# Patient Record
Sex: Female | Born: 1955 | Race: White | Hispanic: No | Marital: Married | Smoking: Never smoker
Health system: Southern US, Community
[De-identification: ages and names within clinical notes are randomized; demographics above are authoritative.]

## PROBLEM LIST (undated history)

## (undated) DIAGNOSIS — K851 Biliary acute pancreatitis without necrosis or infection: Secondary | ICD-10-CM

## (undated) DIAGNOSIS — S42253A Displaced fracture of greater tuberosity of unspecified humerus, initial encounter for closed fracture: Secondary | ICD-10-CM

## (undated) DIAGNOSIS — F419 Anxiety disorder, unspecified: Secondary | ICD-10-CM

## (undated) DIAGNOSIS — M810 Age-related osteoporosis without current pathological fracture: Secondary | ICD-10-CM

## (undated) DIAGNOSIS — K760 Fatty (change of) liver, not elsewhere classified: Secondary | ICD-10-CM

## (undated) DIAGNOSIS — F32A Depression, unspecified: Secondary | ICD-10-CM

## (undated) DIAGNOSIS — K219 Gastro-esophageal reflux disease without esophagitis: Secondary | ICD-10-CM

## (undated) DIAGNOSIS — R7989 Other specified abnormal findings of blood chemistry: Secondary | ICD-10-CM

## (undated) HISTORY — PX: CERVICAL BIOPSY  W/ LOOP ELECTRODE EXCISION: SUR135

## (undated) HISTORY — PX: COLONOSCOPY: SHX174

## (undated) HISTORY — PX: CHOLECYSTECTOMY: SHX55

## (undated) HISTORY — PX: FRACTURE SURGERY: SHX138

## (undated) HISTORY — PX: EYE SURGERY: SHX253

## (undated) HISTORY — PX: KNEE SURGERY: SHX244

## (undated) HISTORY — PX: OOPHORECTOMY: SHX86

## (undated) HISTORY — PX: REFRACTIVE SURGERY: SHX103

---

## 2020-01-16 ENCOUNTER — Other Ambulatory Visit: Payer: Self-pay | Admitting: Specialist

## 2020-01-16 DIAGNOSIS — Z1231 Encounter for screening mammogram for malignant neoplasm of breast: Secondary | ICD-10-CM

## 2020-02-10 ENCOUNTER — Other Ambulatory Visit: Payer: Self-pay | Admitting: Specialist

## 2020-02-10 DIAGNOSIS — M810 Age-related osteoporosis without current pathological fracture: Secondary | ICD-10-CM

## 2020-03-10 ENCOUNTER — Ambulatory Visit
Admission: RE | Admit: 2020-03-10 | Discharge: 2020-03-10 | Disposition: A | Discharge status: Home / Self Care | Payer: BC Managed Care – PPO | Source: Ambulatory Visit | Attending: Specialist | Admitting: Specialist

## 2020-03-10 ENCOUNTER — Other Ambulatory Visit: Payer: Self-pay

## 2020-03-10 DIAGNOSIS — M810 Age-related osteoporosis without current pathological fracture: Secondary | ICD-10-CM | POA: Diagnosis present

## 2020-03-10 DIAGNOSIS — Z1231 Encounter for screening mammogram for malignant neoplasm of breast: Secondary | ICD-10-CM | POA: Insufficient documentation

## 2020-12-21 ENCOUNTER — Other Ambulatory Visit: Payer: Self-pay | Admitting: Specialist

## 2020-12-21 DIAGNOSIS — Z1231 Encounter for screening mammogram for malignant neoplasm of breast: Secondary | ICD-10-CM

## 2021-03-13 ENCOUNTER — Other Ambulatory Visit: Payer: Self-pay

## 2021-03-13 ENCOUNTER — Ambulatory Visit (INDEPENDENT_AMBULATORY_CARE_PROVIDER_SITE_OTHER): Payer: BC Managed Care – PPO

## 2021-03-13 ENCOUNTER — Ambulatory Visit
Admission: EM | Admit: 2021-03-13 | Discharge: 2021-03-13 | Disposition: A | Discharge status: Home / Self Care | Payer: BC Managed Care – PPO | Attending: Family Medicine | Admitting: Family Medicine

## 2021-03-13 DIAGNOSIS — W19XXXA Unspecified fall, initial encounter: Secondary | ICD-10-CM | POA: Diagnosis not present

## 2021-03-13 DIAGNOSIS — S52501A Unspecified fracture of the lower end of right radius, initial encounter for closed fracture: Secondary | ICD-10-CM

## 2021-03-13 DIAGNOSIS — M25531 Pain in right wrist: Secondary | ICD-10-CM

## 2021-03-13 MED ORDER — OXYCODONE-ACETAMINOPHEN 5-325 MG PO TABS: 1.0000 | ORAL_TABLET | Freq: Three times a day (TID) | ORAL | 0 refills | Status: DC | PRN

## 2021-03-13 NOTE — Discharge Instructions (Addendum)
Medication as needed.  If you worsen, go to the ER.  Please call [facility] clinic Orthopedics [phone]) OR [facility] [phone]) for an appt.  Take care  Dr. Adriana Simas

## 2021-03-13 NOTE — ED Triage Notes (Signed)
Pt c/o fall today and and injured her right wrist. Pt also has scrapes to her right knee along with some swelling, denies pain. Pt also reports some neck pain. Pt does have swelling and bruising to the wrist, with decreased ROM. Pt has full sensation to the fingers, slight decreased ROM. Pt denies hitting her head or any other significant injuries.

## 2021-03-13 NOTE — Consult Note (Signed)
  ORTHOPAEDIC CONSULTATION  REQUESTING PHYSICIAN: Tommie Sams, DO  Chief Complaint: Right wrist pain status post fall  HPI: I was contacted by Dr. Adriana Simas at the [facility] urgent care regarding Linda Ritter, who is a 65 y.o. female who sustained a fall prior to her presentation.  X-rays of the right wrist demonstrated a distal radius fracture.  Orthopedics is consulted for recommendations on acute management.  Dr. Adriana Simas indicated the patient is neurovascular intact and skin is closed over the right wrist.  History reviewed. No pertinent past medical history. Past Surgical History:  Procedure Laterality Date   OOPHORECTOMY Right    Social History   Socioeconomic History   Marital status: Married    Spouse name: Not on file   Number of children: Not on file   Years of education: Not on file   Highest education level: Not on file  Occupational History   Not on file  Tobacco Use   Smoking status: Never   Smokeless tobacco: Never  Vaping Use   Vaping Use: Never used  Substance and Sexual Activity   Alcohol use: Not on file   Drug use: Not on file   Sexual activity: Not on file  Other Topics Concern   Not on file  Social History Narrative   Not on file   Social Determinants of Health   Financial Resource Strain: Not on file  Food Insecurity: Not on file  Transportation Needs: Not on file  Physical Activity: Not on file  Stress: Not on file  Social Connections: Not on file   Family History  Problem Relation Age of Onset   Breast cancer Neg Hx    No Known Allergies Prior to Admission medications   Medication Sig Start Date End Date Taking? Authorizing Provider  ergocalciferol (VITAMIN D2) 1.25 MG (50000 UT) capsule Take by mouth.   Yes [provider]  oxyCODONE-acetaminophen (PERCOCET/ROXICET) 5-325 MG tablet Take 1 tablet by mouth every 8 (eight) hours as needed for severe pain. 03/13/21  Yes Cook, Jayce G, DO  sertraline (ZOLOFT) 25 MG tablet Take 25 mg by  mouth daily. 03/11/21  Yes [provider]   DG Wrist Complete Right  Result Date: 03/13/2021 CLINICAL DATA:  Fall today EXAM: RIGHT WRIST - COMPLETE 3+ VIEW COMPARISON:  None. FINDINGS: Comminuted fracture distal radius extending into the wrist joint. Angulation and ventral displacement of the wrist. No fracture of the ulna. No carpal fracture. IMPRESSION: Comminuted intra-articular fracture distal radius. Electronically Signed   By: Marlan Palau M.D.   On: 03/13/2021 12:19     Assessment: Volarly angulated right comminuted, closed distal radius fracture  Plan: I reviewed the patient's plain films.  Patient has a comminuted fracture of the right distal radius with volar angulation and intra-articular extension.  I recommend the patient be splinted in situ.  She may follow-up in our office early next week for further fracture management.  No surgery is required acutely this weekend.  Recommend elevation of the right lower extremity whenever possible.  Patient may benefit from a sling while standing or ambulating to reduce swelling.    Juanell Fairly, MD    03/13/2021 12:46 PM

## 2021-03-13 NOTE — ED Provider Notes (Signed)
[facility]    CSN: [id number] Arrival date & time: 03/13/21  1101      History   Chief Complaint Chief Complaint  Patient presents with   Wrist Pain    right   Fall    03/13/21   HPI  65 year old female presents with the above complaint.  Patient was going for a walk today and suffered a fall after stepping on some debris.  She fell on outstretched right hand.  She reports severe right wrist pain and swelling.  She also has some stiffness of the neck.  She has some abrasions to the right knee.  No head injury.  No reports of syncope.  No relieving factors.  No other injuries.  No other complaints or concerns at this time.   Home Medications    Prior to Admission medications   Medication Sig Start Date End Date Taking? Authorizing Provider  ergocalciferol (VITAMIN D2) 1.25 MG (50000 UT) capsule Take by mouth.   Yes [provider]  oxyCODONE-acetaminophen (PERCOCET/ROXICET) 5-325 MG tablet Take 1 tablet by mouth every 8 (eight) hours as needed for severe pain. 03/13/21  Yes Shrihan Putt G, DO  sertraline (ZOLOFT) 25 MG tablet Take 25 mg by mouth daily. 03/11/21  Yes [provider]    Family History Family History  Problem Relation Age of Onset   Breast cancer Neg Hx     Social History Social History   Tobacco Use   Smoking status: Never   Smokeless tobacco: Never  Vaping Use   Vaping Use: Never used     Allergies   Patient has no known allergies.   Review of Systems Review of Systems Per HPI  Physical Exam Triage Vital Signs ED Triage Vitals  Enc Vitals Group     BP 03/13/21 1120 104/71     Pulse Rate 03/13/21 1120 74     Resp 03/13/21 1120 18     Temp 03/13/21 1120 99 F (37.2 C)     Temp Source 03/13/21 1120 Oral     SpO2 03/13/21 1120 98 %     Weight 03/13/21 1117 140 lb (63.5 kg)     Height 03/13/21 1117 5\' 1"  (1.549 m)     Head Circumference --      Peak Flow --      Pain Score 03/13/21 1117 10     Pain Loc --       Pain Edu? --      Excl. in GC? --    Updated Vital Signs BP 104/71 (BP Location: Left Arm)   Pulse 74   Temp 99 F (37.2 C) (Oral)   Resp 18   Ht 5\' 1"  (1.549 m)   Wt 63.5 kg   SpO2 98%   BMI 26.45 kg/m   Visual Acuity Right Eye Distance:   Left Eye Distance:   Bilateral Distance:    Right Eye Near:   Left Eye Near:    Bilateral Near:     Physical Exam Vitals and nursing note reviewed.  Constitutional:      General: She is not in acute distress.    Appearance: Normal appearance.  HENT:     Head: Normocephalic and atraumatic.  Pulmonary:     Effort: Pulmonary effort is normal. No respiratory distress.  Musculoskeletal:     Comments: Right wrist -swelling noted particularly on the ulnar aspect.  Wrist is diffusely tender to palpation.  Neurovascular intact distally.  Neurological:     Mental  Status: She is alert.  Psychiatric:        Mood and Affect: Mood normal.        Behavior: Behavior normal.    UC Treatments / Results  Labs (all labs ordered are listed, but only abnormal results are displayed) Labs Reviewed - No data to display  EKG   Radiology DG Wrist Complete Right  Result Date: 03/13/2021 CLINICAL DATA:  Fall today EXAM: RIGHT WRIST - COMPLETE 3+ VIEW COMPARISON:  None. FINDINGS: Comminuted fracture distal radius extending into the wrist joint. Angulation and ventral displacement of the wrist. No fracture of the ulna. No carpal fracture. IMPRESSION: Comminuted intra-articular fracture distal radius. Electronically Signed   By: Marlan Palau M.D.   On: 03/13/2021 12:19    Procedures Procedures (including critical care time)  Medications Ordered in UC Medications - No data to display  Initial Impression / Assessment and Plan / UC Course  I have reviewed the triage vital signs and the nursing notes.  Pertinent labs & imaging results that were available during my care of the patient were reviewed by me and considered in my medical decision  making (see chart for details).    65 year old female presents with wrist pain after suffering a fall.  X-ray was obtained and was intimately reviewed by me.  Interpretation: Comminuted intra-articular fracture of the distal radius.  There is angulation and displacement.  Discussed case with on-call orthopedist Dr. Martha Clan.  He recommended splinting and follow-up on Monday.  I suspect that she will need surgical repair.  Placed in sugar-tong splint.  Oxycodone as needed for pain.  Supportive care.  Final Clinical Impressions(s) / UC Diagnoses   Final diagnoses:  Closed fracture of distal end of right radius, unspecified fracture morphology, initial encounter     Discharge Instructions      Medication as needed.  If you worsen, go to the ER.  Please call [facility] clinic Orthopedics [phone]) OR [facility] [phone]) for an appt.  Take care  Dr. Adriana Simas    ED Prescriptions     Medication Sig Dispense Auth. Provider   oxyCODONE-acetaminophen (PERCOCET/ROXICET) 5-325 MG tablet Take 1 tablet by mouth every 8 (eight) hours as needed for severe pain. 15 tablet Everlene Other G, DO      I have reviewed the PDMP during this encounter.   Tommie Sams, [place] 03/13/21 1629

## 2021-03-15 ENCOUNTER — Other Ambulatory Visit: Payer: Self-pay

## 2021-03-15 ENCOUNTER — Ambulatory Visit
Admission: RE | Admit: 2021-03-15 | Discharge: 2021-03-15 | Disposition: A | Discharge status: Home / Self Care | Payer: BC Managed Care – PPO | Source: Ambulatory Visit | Attending: Specialist | Admitting: Specialist

## 2021-03-15 DIAGNOSIS — Z1231 Encounter for screening mammogram for malignant neoplasm of breast: Secondary | ICD-10-CM | POA: Insufficient documentation

## 2022-01-20 ENCOUNTER — Other Ambulatory Visit: Payer: Self-pay | Admitting: Specialist

## 2022-01-20 DIAGNOSIS — Z1231 Encounter for screening mammogram for malignant neoplasm of breast: Secondary | ICD-10-CM

## 2022-03-16 ENCOUNTER — Ambulatory Visit
Admission: RE | Admit: 2022-03-16 | Discharge: 2022-03-16 | Disposition: A | Discharge status: Home / Self Care | Payer: Medicare Other | Source: Ambulatory Visit | Attending: Specialist | Admitting: Specialist

## 2022-03-16 DIAGNOSIS — Z1231 Encounter for screening mammogram for malignant neoplasm of breast: Secondary | ICD-10-CM | POA: Insufficient documentation

## 2022-05-09 ENCOUNTER — Other Ambulatory Visit: Payer: Self-pay | Admitting: Specialist

## 2022-05-09 DIAGNOSIS — M8589 Other specified disorders of bone density and structure, multiple sites: Secondary | ICD-10-CM

## 2022-05-29 ENCOUNTER — Other Ambulatory Visit: Payer: Self-pay

## 2022-05-29 ENCOUNTER — Encounter: Payer: Self-pay | Admitting: Emergency Medicine

## 2022-05-29 ENCOUNTER — Ambulatory Visit (INDEPENDENT_AMBULATORY_CARE_PROVIDER_SITE_OTHER): Payer: Medicare Other

## 2022-05-29 ENCOUNTER — Ambulatory Visit
Admission: EM | Admit: 2022-05-29 | Discharge: 2022-05-29 | Disposition: A | Discharge status: Home / Self Care | Payer: Medicare Other

## 2022-05-29 DIAGNOSIS — M25461 Effusion, right knee: Secondary | ICD-10-CM | POA: Diagnosis not present

## 2022-05-29 DIAGNOSIS — M25562 Pain in left knee: Secondary | ICD-10-CM

## 2022-05-29 DIAGNOSIS — M25561 Pain in right knee: Secondary | ICD-10-CM | POA: Diagnosis not present

## 2022-05-29 DIAGNOSIS — M1120 Other chondrocalcinosis, unspecified site: Secondary | ICD-10-CM | POA: Diagnosis not present

## 2022-05-29 MED ORDER — MELOXICAM 7.5 MG PO TABS: 7.5000 mg | ORAL_TABLET | Freq: Every day | ORAL | 0 refills | Status: AC

## 2022-05-29 NOTE — ED Provider Notes (Signed)
[facility]    CSN: [id number] Arrival date & time: 05/29/22  1404      History   Chief Complaint Chief Complaint  Patient presents with   Fall   Knee Pain    HPI Linda Ritter is a 66 y.o. female.   Pleasant 66 year old female presents today due to concern of bilateral knee pain.  She and her husband were eating at [employer], and walking to her car she stepped in a pothole.  She fell forward onto her knees, slightly twisting her knees.  She has a mild abrasion to her left anterior knee.  She reports tenderness to the left knee primarily to the LCL region and lateral tibial plateau.  Patient has a knee effusion on the right, states this is new since the fall.  Patient has a history of patellar tendon surgery 35 years ago.  She otherwise does not have any chronic knee issues. Has not tried any medications for the pain.   Fall  Knee Pain   History reviewed. No pertinent past medical history.  There are no problems to display for this patient.   Past Surgical History:  Procedure Laterality Date   FRACTURE SURGERY Right    wrist   OOPHORECTOMY Right     OB History   No obstetric history on file.      Home Medications    Prior to Admission medications   Medication Sig Start Date End Date Taking? Authorizing Provider  meloxicam (MOBIC) 7.5 MG tablet Take 1 tablet (7.5 mg total) by mouth daily for 14 days. 05/29/22 06/12/22 Yes Happy Ky L, PA  Multiple Vitamin (MULTIVITAMIN) tablet Take 1 tablet by mouth daily.   Yes [provider]  sertraline (ZOLOFT) 25 MG tablet Take 25 mg by mouth daily. 03/11/21  Yes [provider]    Family History Family History  Problem Relation Age of Onset   Breast cancer Neg Hx     Social History Social History   Tobacco Use   Smoking status: Never   Smokeless tobacco: Never  Vaping Use   Vaping Use: Never used  Substance Use Topics   Alcohol use: Never   Drug use: Never      Allergies   Patient has no known allergies.   Review of Systems Review of Systems As per HPI  Physical Exam Triage Vital Signs ED Triage Vitals  Enc Vitals Group     BP 05/29/22 1442 136/78     Pulse Rate 05/29/22 1442 80     Resp 05/29/22 1442 16     Temp 05/29/22 1442 98.4 F (36.9 C)     Temp Source 05/29/22 1442 Oral     SpO2 05/29/22 1442 98 %     Weight 05/29/22 1441 139 lb 15.9 oz (63.5 kg)     Height 05/29/22 1441 5\' 1"  (1.549 m)     Head Circumference --      Peak Flow --      Pain Score 05/29/22 1440 5     Pain Loc --      Pain Edu? --      Excl. in GC? --    No data found.  Updated Vital Signs BP 136/78 (BP Location: Right Arm)   Pulse 80   Temp 98.4 F (36.9 C) (Oral)   Resp 16   Ht 5\' 1"  (1.549 m)   Wt 139 lb 15.9 oz (63.5 kg)   SpO2 98%   BMI 26.45 kg/m  Visual Acuity Right Eye Distance:   Left Eye Distance:   Bilateral Distance:    Right Eye Near:   Left Eye Near:    Bilateral Near:     Physical Exam Vitals and nursing note reviewed. Exam conducted with a chaperone present.  Musculoskeletal:     Right knee: Effusion present. No swelling (effusion), deformity, erythema, ecchymosis, lacerations, bony tenderness or crepitus. Normal range of motion. Tenderness present over the patellar tendon. No LCL laxity, MCL laxity, ACL laxity or PCL laxity. Normal alignment, normal meniscus and normal patellar mobility. Normal pulse.     Instability Tests: Anterior drawer test negative. Posterior drawer test negative. Anterior Lachman test negative. Medial McMurray test negative and lateral McMurray test negative.     Left knee: Bony tenderness (lateral tibeal plateau) present. No ecchymosis or crepitus. Normal range of motion. Tenderness present over the lateral joint line. Normal alignment, normal meniscus and normal patellar mobility.     Right lower leg: Normal.     Left lower leg: Normal.       Legs:     Comments: Red line annotates  tenderness Green annotates effusion Blue annotates abrasion      UC Treatments / Results  Labs (all labs ordered are listed, but only abnormal results are displayed) Labs Reviewed - No data to display  EKG   Radiology DG Knee Complete 4 Views Right  Result Date: 05/29/2022 CLINICAL DATA:  Trauma, fall, pain EXAM: RIGHT KNEE - COMPLETE 4+ VIEW COMPARISON:  None Available. FINDINGS: No fracture or dislocation is seen. There is no significant effusion. Moderate degenerative changes are noted with bony spurs and chondrocalcinosis. IMPRESSION: No recent fracture or dislocation is seen in right knee. Degenerative changes are noted. Electronically Signed   By: Ernie Avena M.D.   On: 05/29/2022 15:13   DG Knee Complete 4 Views Left  Result Date: 05/29/2022 CLINICAL DATA:  Trauma, pain and swelling EXAM: LEFT KNEE - COMPLETE 4+ VIEW COMPARISON:  None Available. FINDINGS: No fracture or dislocation is seen. Minimal bony spurs are noted. There is no significant effusion. IMPRESSION: No fracture or dislocation is seen in left knee. Electronically Signed   By: Ernie Avena M.D.   On: 05/29/2022 15:12    Procedures Procedures (including critical care time)  Medications Ordered in UC Medications - No data to display  Initial Impression / Assessment and Plan / UC Course  I have reviewed the triage vital signs and the nursing notes.  Pertinent labs & imaging results that were available during my care of the patient were reviewed by me and considered in my medical decision making (see chart for details).     Acute B knee pain -status post fall.  Bilateral knee x-rays obtained.  No acute fractures or bony trauma noted.  Osteoarthritis noted.  We will do trial of p.o. mobic Knee effusion right -no signs of instability of her knee.  X-ray reveals chondrocalcinosis.  Question if this is the cause.  We will do trial of NSAIDs, if ineffective, patient will need to follow-up with Ortho for  joint aspiration and steroid injection. Chondrocalcinosis -as above Abrasion -mild.  Topical antibiotic ointment and a sterile bandage appropriate.   Final Clinical Impressions(s) / UC Diagnoses   Final diagnoses:  Acute bilateral knee pain  Knee effusion, right  Chondrocalcinosis     Discharge Instructions      You do not have a fracture, dislocation, or avulsion in either knee. You do have numerous bone spurs, which  are consistent with osteoarthritis. 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 This may be the cause of your joint swelling. I have called in an antiinflammatory medication for you to take. Please clean the abrasion with soap and water at home, dress it with topical bacitracin and a bandaid. Please follow up with orthopedics if your symptoms persist >1 week.   ED Prescriptions     Medication Sig Dispense Auth. Provider   meloxicam (MOBIC) 7.5 MG tablet Take 1 tablet (7.5 mg total) by mouth daily for 14 days. 14 tablet Jordain Radin L, [place]      PDMP not reviewed this encounter.   Maretta Bees, [place] 05/29/22 1805

## 2022-05-29 NOTE — ED Triage Notes (Signed)
Pt fell about an hour ago. She states she fell on asphalt. She has bilateral knee pain and abrasion on her left knee. She has some swelling in the area and states she just feels sore all over.

## 2022-05-29 NOTE — Discharge Instructions (Addendum)
You do not have a fracture, dislocation, or avulsion in either knee. You do have numerous bone spurs, which are consistent with osteoarthritis. 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 This may be the cause of your joint swelling. I have called in an antiinflammatory medication for you to take. Please clean the abrasion with soap and water at home, dress it with topical bacitracin and a bandaid. Please follow up with orthopedics if your symptoms persist >1 week.

## 2022-06-20 ENCOUNTER — Ambulatory Visit
Admission: RE | Admit: 2022-06-20 | Discharge: 2022-06-20 | Disposition: A | Discharge status: Home / Self Care | Payer: Medicare Other | Source: Ambulatory Visit | Attending: Specialist | Admitting: Specialist

## 2022-06-20 DIAGNOSIS — M8589 Other specified disorders of bone density and structure, multiple sites: Secondary | ICD-10-CM | POA: Diagnosis present

## 2023-01-05 ENCOUNTER — Other Ambulatory Visit: Payer: Self-pay | Admitting: Family Medicine

## 2023-01-05 DIAGNOSIS — Z1231 Encounter for screening mammogram for malignant neoplasm of breast: Secondary | ICD-10-CM

## 2023-03-04 ENCOUNTER — Encounter: Payer: Self-pay | Admitting: Emergency Medicine

## 2023-03-04 DIAGNOSIS — K859 Acute pancreatitis without necrosis or infection, unspecified: Secondary | ICD-10-CM | POA: Diagnosis not present

## 2023-03-04 DIAGNOSIS — Z79899 Other long term (current) drug therapy: Secondary | ICD-10-CM

## 2023-03-04 DIAGNOSIS — K760 Fatty (change of) liver, not elsewhere classified: Secondary | ICD-10-CM | POA: Diagnosis present

## 2023-03-04 DIAGNOSIS — K8020 Calculus of gallbladder without cholecystitis without obstruction: Secondary | ICD-10-CM | POA: Diagnosis present

## 2023-03-04 DIAGNOSIS — K219 Gastro-esophageal reflux disease without esophagitis: Secondary | ICD-10-CM | POA: Diagnosis present

## 2023-03-04 DIAGNOSIS — K851 Biliary acute pancreatitis without necrosis or infection: Principal | ICD-10-CM | POA: Diagnosis present

## 2023-03-04 DIAGNOSIS — R188 Other ascites: Secondary | ICD-10-CM | POA: Diagnosis present

## 2023-03-04 MED ORDER — ONDANSETRON 4 MG PO TBDP
4.0000 mg | ORAL_TABLET | Freq: Once | ORAL | Status: AC
  Administered 2023-03-04: 4 mg via ORAL
  Filled 2023-03-04: qty 1

## 2023-03-04 NOTE — ED Triage Notes (Addendum)
Pt presents ambulatory to triage via [facility] with complaints of lower abdominal pain with associated N/V/D x 1 day. Rates the pain 8/10 - no abdominal surgeries. No meds taken PTA. A&Ox4 at this time. Denies hematuria, CP or SOB.

## 2023-03-05 ENCOUNTER — Other Ambulatory Visit: Payer: Self-pay

## 2023-03-05 ENCOUNTER — Emergency Department: Payer: Medicare PPO

## 2023-03-05 ENCOUNTER — Inpatient Hospital Stay
Admission: EM | Admit: 2023-03-05 | Discharge: 2023-03-07 | DRG: 418 | Disposition: A | Discharge status: Home / Self Care | Payer: Medicare PPO | Attending: Internal Medicine | Admitting: Internal Medicine

## 2023-03-05 ENCOUNTER — Encounter: Payer: Self-pay | Admitting: Radiology

## 2023-03-05 DIAGNOSIS — K859 Acute pancreatitis without necrosis or infection, unspecified: Secondary | ICD-10-CM | POA: Diagnosis present

## 2023-03-05 DIAGNOSIS — K8020 Calculus of gallbladder without cholecystitis without obstruction: Secondary | ICD-10-CM | POA: Diagnosis present

## 2023-03-05 DIAGNOSIS — Z79899 Other long term (current) drug therapy: Secondary | ICD-10-CM | POA: Diagnosis not present

## 2023-03-05 DIAGNOSIS — R188 Other ascites: Secondary | ICD-10-CM | POA: Diagnosis present

## 2023-03-05 DIAGNOSIS — K760 Fatty (change of) liver, not elsewhere classified: Secondary | ICD-10-CM | POA: Diagnosis present

## 2023-03-05 DIAGNOSIS — R7989 Other specified abnormal findings of blood chemistry: Secondary | ICD-10-CM | POA: Diagnosis present

## 2023-03-05 DIAGNOSIS — K851 Biliary acute pancreatitis without necrosis or infection: Secondary | ICD-10-CM | POA: Diagnosis present

## 2023-03-05 DIAGNOSIS — K219 Gastro-esophageal reflux disease without esophagitis: Secondary | ICD-10-CM | POA: Diagnosis present

## 2023-03-05 LAB — CREATININE, SERUM
Creatinine, Ser: 0.89 mg/dL (ref 0.44–1.00)
GFR, Estimated: >60 mL/min (ref >60)

## 2023-03-05 LAB — URINALYSIS, ROUTINE W REFLEX MICROSCOPIC
Bacteria, UA: NONE SEEN
Bilirubin Urine: NEGATIVE
Glucose, UA: NEGATIVE mg/dL
Hgb urine dipstick: NEGATIVE
Ketones, ur: NEGATIVE mg/dL
Leukocytes,Ua: NEGATIVE
Nitrite: NEGATIVE
Protein, ur: 30 mg/dL — AB
Specific Gravity, Urine: 1.035 — ABNORMAL HIGH (ref 1.005–1.030)
Squamous Epithelial / HPF: NONE SEEN /HPF (ref 0–5)
pH: 5 (ref 5.0–8.0)

## 2023-03-05 LAB — COMPREHENSIVE METABOLIC PANEL
ALT: 118 U/L — ABNORMAL HIGH (ref 0–44)
AST: 163 U/L — ABNORMAL HIGH (ref 15–41)
Albumin: 4.6 g/dL (ref 3.5–5.0)
Alkaline Phosphatase: 87 U/L (ref 38–126)
Anion gap: 13 (ref 5–15)
BUN: 26 mg/dL — ABNORMAL HIGH (ref 8–23)
CO2: 23 mmol/L (ref 22–32)
Calcium: 9.2 mg/dL (ref 8.9–10.3)
Chloride: 102 mmol/L (ref 98–111)
Creatinine, Ser: 0.94 mg/dL (ref 0.44–1.00)
GFR, Estimated: >60 mL/min (ref >60)
Glucose, Bld: 167 mg/dL — ABNORMAL HIGH (ref 70–99)
Potassium: 4.3 mmol/L (ref 3.5–5.1)
Sodium: 138 mmol/L (ref 135–145)
Total Bilirubin: 1.1 mg/dL (ref 0.3–1.2)
Total Protein: 7.6 g/dL (ref 6.5–8.1)

## 2023-03-05 LAB — CBC
HCT: 43.7 % (ref 36.0–46.0)
HCT: 45 % (ref 36.0–46.0)
Hemoglobin: 14.3 g/dL (ref 12.0–15.0)
Hemoglobin: 14.7 g/dL (ref 12.0–15.0)
MCH: 29.7 pg (ref 26.0–34.0)
MCH: 30.1 pg (ref 26.0–34.0)
MCHC: 32.7 g/dL (ref 30.0–36.0)
MCHC: 32.7 g/dL (ref 30.0–36.0)
MCV: 90.7 fL (ref 80.0–100.0)
MCV: 92 fL (ref 80.0–100.0)
Platelets: 271 10*3/uL (ref 150–400)
Platelets: 330 10*3/uL (ref 150–400)
RBC: 4.82 MIL/uL (ref 3.87–5.11)
RBC: 4.89 MIL/uL (ref 3.87–5.11)
RDW: 13.7 % (ref 11.5–15.5)
RDW: 14 % (ref 11.5–15.5)
WBC: 14 10*3/uL — ABNORMAL HIGH (ref 4.0–10.5)
WBC: 16.5 10*3/uL — ABNORMAL HIGH (ref 4.0–10.5)
nRBC: 0 % (ref 0.0–0.2)
nRBC: 0 % (ref 0.0–0.2)

## 2023-03-05 LAB — LIPID PANEL
Cholesterol: 163 mg/dL (ref 0–200)
HDL: 75 mg/dL (ref >40)
LDL Cholesterol: 84 mg/dL (ref 0–99)
Total CHOL/HDL Ratio: 2.2 RATIO
Triglycerides: 19 mg/dL (ref <150)
VLDL: 4 mg/dL (ref 0–40)

## 2023-03-05 LAB — LIPASE, BLOOD: Lipase: 1316 U/L — ABNORMAL HIGH (ref 11–51)

## 2023-03-05 MED ORDER — ONDANSETRON HCL 4 MG/2ML IJ SOLN
4.0000 mg | Freq: Four times a day (QID) | INTRAMUSCULAR | Status: DC | PRN
  Administered 2023-03-05 (×2): 4 mg via INTRAVENOUS
  Filled 2023-03-05 (×2): qty 2

## 2023-03-05 MED ORDER — ENOXAPARIN SODIUM 40 MG/0.4ML IJ SOSY
40.0000 mg | PREFILLED_SYRINGE | INTRAMUSCULAR | Status: DC
  Administered 2023-03-05 – 2023-03-07 (×2): 40 mg via SUBCUTANEOUS
  Filled 2023-03-05 (×2): qty 0.4

## 2023-03-05 MED ORDER — ONDANSETRON HCL 4 MG PO TABS
4.0000 mg | ORAL_TABLET | Freq: Four times a day (QID) | ORAL | Status: DC | PRN
  Administered 2023-03-07: 4 mg via ORAL
  Filled 2023-03-05: qty 1

## 2023-03-05 MED ORDER — GADOBUTROL 1 MMOL/ML IV SOLN
6.0000 mL | Freq: Once | INTRAVENOUS | Status: AC | PRN
  Administered 2023-03-05: 6 mL via INTRAVENOUS

## 2023-03-05 MED ORDER — MORPHINE SULFATE (PF) 4 MG/ML IV SOLN
4.0000 mg | Freq: Once | INTRAVENOUS | Status: AC
  Administered 2023-03-05: 4 mg via INTRAVENOUS
  Filled 2023-03-05: qty 1

## 2023-03-05 MED ORDER — HYDROMORPHONE HCL 1 MG/ML IJ SOLN
0.5000 mg | INTRAMUSCULAR | Status: DC | PRN
  Administered 2023-03-05 – 2023-03-06 (×5): 1 mg via INTRAVENOUS
  Administered 2023-03-06: 0.5 mg via INTRAVENOUS
  Administered 2023-03-07: 1 mg via INTRAVENOUS
  Filled 2023-03-05 (×7): qty 1

## 2023-03-05 MED ORDER — IOHEXOL 300 MG/ML  SOLN
100.0000 mL | Freq: Once | INTRAMUSCULAR | Status: AC | PRN
  Administered 2023-03-05: 100 mL via INTRAVENOUS

## 2023-03-05 MED ORDER — ACETAMINOPHEN 325 MG PO TABS
650.0000 mg | ORAL_TABLET | Freq: Four times a day (QID) | ORAL | Status: DC | PRN
  Administered 2023-03-05 – 2023-03-06 (×3): 650 mg via ORAL
  Filled 2023-03-05 (×3): qty 2

## 2023-03-05 MED ORDER — LACTATED RINGERS IV SOLN: INTRAVENOUS | Status: DC

## 2023-03-05 MED ORDER — ONDANSETRON HCL 4 MG/2ML IJ SOLN
4.0000 mg | Freq: Once | INTRAMUSCULAR | Status: AC
  Administered 2023-03-05: 4 mg via INTRAVENOUS
  Filled 2023-03-05: qty 2

## 2023-03-05 MED ORDER — LACTATED RINGERS IV BOLUS
1000.0000 mL | Freq: Once | INTRAVENOUS | Status: AC
  Administered 2023-03-05: 1000 mL via INTRAVENOUS

## 2023-03-05 MED ORDER — ACETAMINOPHEN 650 MG RE SUPP: 650.0000 mg | Freq: Four times a day (QID) | RECTAL | Status: DC | PRN

## 2023-03-05 MED ORDER — HYDROMORPHONE HCL 1 MG/ML IJ SOLN
0.5000 mg | Freq: Once | INTRAMUSCULAR | Status: AC
  Administered 2023-03-05: 0.5 mg via INTRAVENOUS
  Filled 2023-03-05: qty 0.5

## 2023-03-05 NOTE — ED Notes (Signed)
Taken to CT.

## 2023-03-05 NOTE — ED Notes (Signed)
Advised nurse that patient has ready bed 

## 2023-03-05 NOTE — Hospital Course (Addendum)
Taken from H&P.   Linda Ritter is a 67 y.o. female with No significant past medical history and no history of prior abdominal surgeries who presents to the ED with 1 day history of abdominal pain, nausea and nonbloody nonbilious vomiting not responding to over-the-counter omeprazole.   ED course and data review: Vitals within normal limits.  Labs significant for WBC of 14,000, lipase of 1316.  AST 163 and ALT 118. Patient had extensive imaging that included CT abdomen and pelvis with contrast, right upper quadrant ultrasound and ultimately MRCP, which showed no convincing evidence of choledocholithiasis as follows: IMPRESSION: 1. Imaging findings compatible with acute pancreatitis. No signs of pancreatic necrosis or pseudocyst formation. 2. Gallstones. 3. Increased caliber of the common bile duct measures 8 mm. Abrupt narrowing of the distal common bile duct is identified just before the ampulla. No convincing evidence for choledocholithiasis. 4. Hepatic steatosis.  General surgery was consulted.  6/9: Vitals stable.  Worsening leukocytosis at 16.5.  UA with mild protein urea only. Lipid panel normal with triglyceride of 19 only.  Continue to have pain. Surgery is recommending doing cholecystectomy after improving pancreatitis.  6/10: Hemodynamically stable.  Clinically improving with significant improvement in lipase to 167 from 1316.  Transaminitis started improving.  Surgery decided to take her to the OR today for robotic cholecystectomy.  6/11: Patient has successful robotic cholecystectomy yesterday.  Noted to have some white spots on omentum-biopsies were taken and general surgery will follow-up. Pain seems well-controlled except mild bloatedness.  Tolerating diet.  Surgery cleared her for discharge.  She was given some Norco and Zofran to be used as needed.  Postoperative instructions were provided in AVS.  Discussed with patient and husband at bedside.  Patient will continue  on current medications and need to have a close follow-up with her providers for further recommendations.

## 2023-03-05 NOTE — ED Provider Notes (Addendum)
[facility] Provider Note    Event Date/Time   First MD Initiated Contact with Patient 03/05/23 [id number]     (approximate)   History   Abdominal Pain   HPI  Linda Ritter is a 67 y.o. female who presents to the ED for evaluation of Abdominal Pain   Patient presents to the ED for evaluation of abdominal pain, nausea and vomiting.  She reports about 24 hours of symptoms.  She acknowledges preceding "indigestion" that she has been treating with OTC omeprazole with improvement in a subacute timeframe.  No known gallstones.  She has had an oophorectomy remotely but no other intra-abdominal surgeries.  She presents with recurrent emesis, severe pain across her abdomen.    Physical Exam   Triage Vital Signs: ED Triage Vitals  Enc Vitals Group     BP 03/04/23 2349 117/76     Pulse Rate 03/04/23 2349 70     Resp 03/04/23 2349 18     Temp 03/04/23 2349 98.4 F (36.9 C)     Temp Source 03/04/23 2349 Oral     SpO2 03/04/23 2349 100 %     Weight 03/04/23 2349 141 lb 8.6 oz (64.2 kg)     Height 03/04/23 2349 5\' 1"  (1.549 m)     Head Circumference --      Peak Flow --      Pain Score 03/04/23 2348 8     Pain Loc --      Pain Edu? --      Excl. in GC? --     Most recent vital signs: Vitals:   03/04/23 2349  BP: 117/76  Pulse: 70  Resp: 18  Temp: 98.4 F (36.9 C)  SpO2: 100%    General: Awake, no distress.  Seems uncomfortable and dry CV:  Good peripheral perfusion.  Resp:  Normal effort.  Abd:  No distention.  Diffuse tenderness, primarily the RUQ, epigastrium and periumbilical.  No peritoneal features MSK:  No deformity noted.  Neuro:  No focal deficits appreciated. Other:     ED Results / Procedures / Treatments   Labs (all labs ordered are listed, but only abnormal results are displayed) Labs Reviewed  LIPASE, BLOOD - Abnormal; Notable for the following components:      Result Value   Lipase 1,316 (*)    All other components within  normal limits  COMPREHENSIVE METABOLIC PANEL - Abnormal; Notable for the following components:   Glucose, Bld 167 (*)    BUN 26 (*)    AST 163 (*)    ALT 118 (*)    All other components within normal limits  CBC - Abnormal; Notable for the following components:   WBC 14.0 (*)    All other components within normal limits  URINALYSIS, ROUTINE W REFLEX MICROSCOPIC - Abnormal; Notable for the following components:   Color, Urine AMBER (*)    APPearance HAZY (*)    Specific Gravity, Urine 1.035 (*)    Protein, ur 30 (*)    All other components within normal limits  CBC  CREATININE, SERUM  HIV ANTIBODY (ROUTINE TESTING W REFLEX)    EKG   RADIOLOGY RUQ ultrasound interpreted by me with gallstones CT abdomen/pelvis interpreted by me with evidence of pancreatitis but no pseudocyst MRI without choledocholithiasis  Official radiology report(s): MR ABDOMEN MRCP W WO CONTAST  Result Date: 03/05/2023 CLINICAL DATA:  Elevated LFTs and gallstone pancreatitis. Evaluate for common bile duct stone. EXAM: MRI ABDOMEN WITHOUT AND  WITH CONTRAST (INCLUDING MRCP) TECHNIQUE: Multiplanar multisequence MR imaging of the abdomen was performed both before and after the administration of intravenous contrast. Heavily T2-weighted images of the biliary and pancreatic ducts were obtained, and three-dimensional MRCP images were rendered by post processing. CONTRAST:  6mL GADAVIST GADOBUTROL 1 MMOL/ML IV SOLN COMPARISON:  U/S abdomen limited 03/05/2023 and CT AP from 03/05/2023 FINDINGS: Lower chest: No acute abnormality. Hepatobiliary: There is diffuse hepatic steatosis. No suspicious enhancing liver lesion. Gallstones. The majority of these are tiny measuring 2-3 mm or less. There is a dominant stone measuring 1.2 cm. No gallbladder wall thickening. Increased caliber of the common bile duct measures 8 mm, image 18/3. Abrupt narrowing of the distal common bile duct is identified just before the ampulla. No convincing  evidence for choledocholithiasis. Pancreas: There is diffuse pancreatic edema with peripancreatic inflammatory fat stranding with fluid tracking into bilateral retroperitoneum. No signs of pancreatic necrosis or pseudocyst formation. Spleen:  Within normal limits in size and appearance. Adrenals/Urinary Tract: Normal adrenal glands. Bilateral renal sinus cysts are identified. No follow-up imaging recommended. No suspicious kidney mass or signs of hydronephrosis. Stomach/Bowel: Stomach appears normal. Mild wall thickening involving the duodenum likely reflects secondary inflammation. No pathologic dilatation of the large or small bowel loops. Vascular/Lymphatic: Normal caliber abdominal aorta. The upper abdominal vascularity appears patent. No adenopathy. Other:  None Musculoskeletal: No suspicious bone lesions identified. IMPRESSION: 1. Imaging findings compatible with acute pancreatitis. No signs of pancreatic necrosis or pseudocyst formation. 2. Gallstones. 3. Increased caliber of the common bile duct measures 8 mm. Abrupt narrowing of the distal common bile duct is identified just before the ampulla. No convincing evidence for choledocholithiasis. 4. Hepatic steatosis. Electronically Signed   By: Signa Kell M.D.   On: 03/05/2023 05:10   MR 3D Recon At Scanner  Result Date: 03/05/2023 CLINICAL DATA:  Elevated LFTs and gallstone pancreatitis. Evaluate for common bile duct stone. EXAM: MRI ABDOMEN WITHOUT AND WITH CONTRAST (INCLUDING MRCP) TECHNIQUE: Multiplanar multisequence MR imaging of the abdomen was performed both before and after the administration of intravenous contrast. Heavily T2-weighted images of the biliary and pancreatic ducts were obtained, and three-dimensional MRCP images were rendered by post processing. CONTRAST:  6mL GADAVIST GADOBUTROL 1 MMOL/ML IV SOLN COMPARISON:  U/S abdomen limited 03/05/2023 and CT AP from 03/05/2023 FINDINGS: Lower chest: No acute abnormality. Hepatobiliary: There  is diffuse hepatic steatosis. No suspicious enhancing liver lesion. Gallstones. The majority of these are tiny measuring 2-3 mm or less. There is a dominant stone measuring 1.2 cm. No gallbladder wall thickening. Increased caliber of the common bile duct measures 8 mm, image 18/3. Abrupt narrowing of the distal common bile duct is identified just before the ampulla. No convincing evidence for choledocholithiasis. Pancreas: There is diffuse pancreatic edema with peripancreatic inflammatory fat stranding with fluid tracking into bilateral retroperitoneum. No signs of pancreatic necrosis or pseudocyst formation. Spleen:  Within normal limits in size and appearance. Adrenals/Urinary Tract: Normal adrenal glands. Bilateral renal sinus cysts are identified. No follow-up imaging recommended. No suspicious kidney mass or signs of hydronephrosis. Stomach/Bowel: Stomach appears normal. Mild wall thickening involving the duodenum likely reflects secondary inflammation. No pathologic dilatation of the large or small bowel loops. Vascular/Lymphatic: Normal caliber abdominal aorta. The upper abdominal vascularity appears patent. No adenopathy. Other:  None Musculoskeletal: No suspicious bone lesions identified. IMPRESSION: 1. Imaging findings compatible with acute pancreatitis. No signs of pancreatic necrosis or pseudocyst formation. 2. Gallstones. 3. Increased caliber of the common bile duct measures  8 mm. Abrupt narrowing of the distal common bile duct is identified just before the ampulla. No convincing evidence for choledocholithiasis. 4. Hepatic steatosis. Electronically Signed   By: Signa Kell M.D.   On: 03/05/2023 05:10   US Abdomen Limited RUQ (LIVER/GB)  Result Date: 03/05/2023 CLINICAL DATA:  Right upper quadrant abdominal pain EXAM: ULTRASOUND ABDOMEN LIMITED RIGHT UPPER QUADRANT COMPARISON:  None Available. FINDINGS: Gallbladder: Multiple layering gallstones are seen within the gallbladder. The gallbladder,  however, is not distended, there is no gallbladder wall thickening, and no pericholecystic fluid is identified. The sonographic Eulah Pont sign is reportedly negative. Common bile duct: Diameter: 6 mm in mid diameter Liver: No focal lesion identified. Within normal limits in parenchymal echogenicity. Portal vein is patent on color Doppler imaging with normal direction of blood flow towards the liver. Other: None. IMPRESSION: 1. Cholelithiasis without sonographic evidence of acute cholecystitis. Electronically Signed   By: Helyn Numbers M.D.   On: 03/05/2023 03:35   CT ABDOMEN PELVIS W CONTRAST  Result Date: 03/05/2023 CLINICAL DATA:  Abdominal pain, acute, nonlocalized EXAM: CT ABDOMEN AND PELVIS WITH CONTRAST TECHNIQUE: Multidetector CT imaging of the abdomen and pelvis was performed using the standard protocol following bolus administration of intravenous contrast. RADIATION DOSE REDUCTION: This exam was performed according to the departmental dose-optimization program which includes automated exposure control, adjustment of the mA and/or kV according to patient size and/or use of iterative reconstruction technique. CONTRAST:  OMNIPAQUE IOHEXOL 300 MG/ML  SOLN COMPARISON:  None Available. FINDINGS: Lower chest: No acute abnormality. Hepatobiliary: Mild hepatic steatosis. No enhancing intrahepatic mass. No intra or extrahepatic biliary ductal dilation. Cholelithiasis without pericholecystic inflammatory change. Pancreas: There is moderate peripancreatic inflammatory stranding with edema tracking into the greater omentum and anterior pararenal spaces bilaterally in keeping with changes of acute interstitial/edematous pancreatitis. Inflammatory changes surround the entire pancreas. The pancreatic duct is not dilated. Normal enhancement of the pancreatic parenchyma; no evidence of parenchymal necrosis. No loculated peripancreatic fluid collections identified. Spleen: Normal in size without focal abnormality.  Adrenals/Urinary Tract: The adrenal glands are unremarkable. 3 mm nonobstructing calculus noted within the upper pole of the right kidney. The kidneys are otherwise unremarkable. Bladder unremarkable. Stomach/Bowel: Mild ascites. Stomach, small bowel, and large bowel are unremarkable. Appendix normal. No free intraperitoneal gas. Vascular/Lymphatic: No significant vascular findings are present. No enlarged abdominal or pelvic lymph nodes. Reproductive: Uterus and bilateral adnexa are unremarkable. Other: No abdominal wall hernia Musculoskeletal: Bilateral L5 pars defects are present without associated spondylolisthesis. Degenerative changes are seen at the lumbosacral junction. No acute bone abnormality. IMPRESSION: 1. Acute interstitial/edematous pancreatitis. No evidence of pancreatic necrosis or loculated peripancreatic fluid collections. 2. Mild hepatic steatosis. 3. Cholelithiasis. 4. Minimal right nonobstructing nephrolithiasis. 5. 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 Electronically Signed   By: Helyn Numbers M.D.   On: 03/05/2023 03:20    PROCEDURES and INTERVENTIONS:  Procedures  Medications  enoxaparin (LOVENOX) injection 40 mg (has no administration in time range)  acetaminophen (TYLENOL) tablet 650 mg (has no administration in time range)    Or  acetaminophen (TYLENOL) suppository 650 mg (has no administration in time range)  ondansetron (ZOFRAN) tablet 4 mg (has no administration in time range)    Or  ondansetron (ZOFRAN) injection 4 mg (has no administration in time range)  lactated ringers infusion (has no administration in time range)  HYDROmorphone (DILAUDID) injection 0.5-1 mg (has no administration in time range)  ondansetron (ZOFRAN-ODT) disintegrating tablet 4 mg (4 mg Oral Given  03/04/23 2354)  iohexol (OMNIPAQUE) 300 MG/ML solution 100 mL (100 mLs Intravenous Contrast Given 03/05/23 0247)  lactated ringers bolus 1,000 mL (1,000 mLs Intravenous New  Bag/Given 03/05/23 0343)  morphine (PF) 4 MG/ML injection 4 mg (4 mg Intravenous Given 03/05/23 0342)  ondansetron (ZOFRAN) injection 4 mg (4 mg Intravenous Given 03/05/23 0341)  gadobutrol (GADAVIST) 1 MMOL/ML injection 6 mL (6 mLs Intravenous Contrast Given 03/05/23 0424)  HYDROmorphone (DILAUDID) injection 0.5 mg (0.5 mg Intravenous Given 03/05/23 0538)     IMPRESSION / MDM / ASSESSMENT AND PLAN / ED COURSE  I reviewed the triage vital signs and the nursing notes.  Differential diagnosis includes, but is not limited to, pancreatitis, cholecystitis, choledocholithiasis, cholelithiasis, SBO, diverticulitis  {Patient presents with symptoms of an acute illness or injury that is potentially life-threatening.  Pleasant 68 year old woman presents with evidence of acute gallstone pancreatitis requiring medical admission.  Clearly uncomfortable with abdominal pain, improving clinical picture fluid resuscitation, antiemetics and analgesics.  Blood work with mild leukocytosis.  LFTs are slightly elevated in the 100s, but normal alk phos and bilirubin.  UA without clear infectious features.  Lipase of 1300.  CT with signs of pancreatitis without complicating features.  RUQ ultrasound with gallstones but no evidence of cholecystitis.  MRCP obtained due to LFT derangements and without evidence of choledocholithiasis.  Consult with medicine for admission  Clinical Course as of 03/05/23 0635  Wynelle Link Mar 05, 2023  0404 Reassessed.  In MRI [DS]  0532 Consult with medicine and they agreed to admit [DS]    Clinical Course User Index [DS] Delton Prairie, MD     FINAL CLINICAL IMPRESSION(S) / ED DIAGNOSES   Final diagnoses:  Acute gallstone pancreatitis     Rx / DC Orders   ED Discharge Orders     None        Note:  This document was prepared using Dragon voice recognition software and may include unintentional dictation errors.   Delton Prairie, MD 03/05/23 1610    Delton Prairie, MD 03/05/23 [phone]

## 2023-03-05 NOTE — Consult Note (Signed)
SURGICAL CONSULTATION NOTE   HISTORY OF PRESENT ILLNESS (HPI):  67 y.o. female presented to [facility] ED for evaluation of abdominal pain. Patient reports started having abdominal pain since 2 days ago.  She endorsed that the pain was mostly on the left side that radiated from the epigastric area to the lower abdomen.  Denies any pain in her back.  Patient cannot identify any alleviating or aggravating factors.  At the ED she was found with severe pain in the abdomen.  She was also found with mild leukocytosis.  CMP with normal bilirubin.  Mild elevation of ALT/ALT.  Lipase was 1316.  She had a CT scan of the abdomen pelvis showing inflammation of the pancreas.  No free air or free fluid.  I personally believe the images.  She had an ultrasound of the abdomen confirming cholelithiasis without sign of cholecystitis.  Common bile duct diameter was 6 mm.  She had an MRCP that was negative for choledocholithiasis.  I personally evaluated the images.  Surgery is consulted by Dr. Nelson Chimes in this context for evaluation and management of gallstone pancreatitis.  PAST MEDICAL HISTORY (PMH):  History reviewed. No pertinent past medical history.   PAST SURGICAL HISTORY (PSH):  Past Surgical History:  Procedure Laterality Date   FRACTURE SURGERY Right    wrist   OOPHORECTOMY Right      MEDICATIONS:  Prior to Admission medications   Medication Sig Start Date End Date Taking? Authorizing Provider  Multiple Vitamin (MULTIVITAMIN) tablet Take 1 tablet by mouth daily.    [provider]  sertraline (ZOLOFT) 25 MG tablet Take 25 mg by mouth daily. 03/11/21   [provider]     ALLERGIES:  No Known Allergies   SOCIAL HISTORY:  Social History   Socioeconomic History   Marital status: Married    Spouse name: Not on file   Number of children: Not on file   Years of education: Not on file   Highest education level: Not on file  Occupational History   Not on file  Tobacco Use   Smoking  status: Never   Smokeless tobacco: Never  Vaping Use   Vaping Use: Never used  Substance and Sexual Activity   Alcohol use: Never   Drug use: Never   Sexual activity: Not on file  Other Topics Concern   Not on file  Social History Narrative   Not on file   Social Determinants of Health   Financial Resource Strain: Not on file  Food Insecurity: Not on file  Transportation Needs: Not on file  Physical Activity: Not on file  Stress: Not on file  Social Connections: Not on file  Intimate Partner Violence: Not on file      FAMILY HISTORY:  Family History  Problem Relation Age of Onset   Breast cancer Neg Hx      REVIEW OF SYSTEMS:  Constitutional: denies weight loss, fever, chills, or sweats  Eyes: denies any other vision changes, history of eye injury  ENT: denies sore throat, hearing problems  Respiratory: denies shortness of breath, wheezing  Cardiovascular: denies chest pain, palpitations  Gastrointestinal: Positive abdominal pain, nausea and vomiting Genitourinary: denies burning with urination or urinary frequency Musculoskeletal: denies any other joint pains or cramps  Skin: denies any other rashes or skin discolorations  Neurological: denies any other headache, dizziness, weakness  Psychiatric: denies any other depression, anxiety   All other review of systems were negative   VITAL SIGNS:  Temp:  [97.8 F (  36.6 C)-98.4 F (36.9 C)] 97.8 F (36.6 C) (06/09 0828) Pulse Rate:  [60-70] 60 (06/09 0828) Resp:  [16-18] 16 (06/09 0828) BP: (116-124)/(71-86) 124/71 (06/09 0828) SpO2:  [97 %-100 %] 99 % (06/09 0828) Weight:  [64.2 kg] 64.2 kg (06/08 2349)     Height: 5\' 1"  (154.9 cm) Weight: 64.2 kg BMI (Calculated): 26.76   INTAKE/OUTPUT:  This shift: No intake/output data recorded.  Last 2 shifts: @IOLAST2SHIFTS @   PHYSICAL EXAM:  Constitutional:  -- Normal body habitus  -- Awake, alert, and oriented x3  Eyes:  -- Pupils equally round and reactive to light   -- No scleral icterus  Ear, nose, and throat:  -- No jugular venous distension  Pulmonary:  -- No crackles  -- Equal breath sounds bilaterally -- Breathing non-labored at rest Cardiovascular:  -- S1, S2 present  -- No pericardial rubs Gastrointestinal:  -- Abdomen soft, nontender, non-distended, no guarding or rebound tenderness -- No abdominal masses appreciated, pulsatile or otherwise  Musculoskeletal and Integumentary:  -- Wounds: None appreciated -- Extremities: B/L UE and LE FROM, hands and feet warm, no edema  Neurologic:  -- Motor function: intact and symmetric -- Sensation: intact and symmetric   Labs:     Latest Ref Rng & Units 03/05/2023    6:44 AM 03/04/2023   11:52 PM  CBC  WBC 4.0 - 10.5 K/uL 16.5  14.0   Hemoglobin 12.0 - 15.0 g/dL 16.1  09.6   Hematocrit 36.0 - 46.0 % 43.7  45.0   Platelets 150 - 400 K/uL 271  330       Latest Ref Rng & Units 03/05/2023    6:44 AM 03/04/2023   11:52 PM  CMP  Glucose 70 - 99 mg/dL  045   BUN 8 - 23 mg/dL  26   Creatinine 4.09 - 1.00 mg/dL 8.11  9.14   Sodium 782 - 145 mmol/L  138   Potassium 3.5 - 5.1 mmol/L  4.3   Chloride 98 - 111 mmol/L  102   CO2 22 - 32 mmol/L  23   Calcium 8.9 - 10.3 mg/dL  9.2   Total Protein 6.5 - 8.1 g/dL  7.6   Total Bilirubin 0.3 - 1.2 mg/dL  1.1   Alkaline Phos 38 - 126 U/L  87   AST 15 - 41 U/L  163   ALT 0 - 44 U/L  118      Imaging studies:  EXAM: CT ABDOMEN AND PELVIS WITH CONTRAST   TECHNIQUE: Multidetector CT imaging of the abdomen and pelvis was performed using the standard protocol following bolus administration of intravenous contrast.   RADIATION DOSE REDUCTION: This exam was performed according to the departmental dose-optimization program which includes automated exposure control, adjustment of the mA and/or kV according to patient size and/or use of iterative reconstruction technique.   CONTRAST:  OMNIPAQUE IOHEXOL 300 MG/ML  SOLN   COMPARISON:  None  Available.   FINDINGS: Lower chest: No acute abnormality.   Hepatobiliary: Mild hepatic steatosis. No enhancing intrahepatic mass. No intra or extrahepatic biliary ductal dilation. Cholelithiasis without pericholecystic inflammatory change.   Pancreas: There is moderate peripancreatic inflammatory stranding with edema tracking into the greater omentum and anterior pararenal spaces bilaterally in keeping with changes of acute interstitial/edematous pancreatitis. Inflammatory changes surround the entire pancreas. The pancreatic duct is not dilated. Normal enhancement of the pancreatic parenchyma; no evidence of parenchymal necrosis. No loculated peripancreatic fluid collections identified.   Spleen: Normal in size  without focal abnormality.   Adrenals/Urinary Tract: The adrenal glands are unremarkable. 3 mm nonobstructing calculus noted within the upper pole of the right kidney. The kidneys are otherwise unremarkable. Bladder unremarkable.   Stomach/Bowel: Mild ascites. Stomach, small bowel, and large bowel are unremarkable. Appendix normal. No free intraperitoneal gas.   Vascular/Lymphatic: No significant vascular findings are present. No enlarged abdominal or pelvic lymph nodes.   Reproductive: Uterus and bilateral adnexa are unremarkable.   Other: No abdominal wall hernia   Musculoskeletal: Bilateral L5 pars defects are present without associated spondylolisthesis. Degenerative changes are seen at the lumbosacral junction. No acute bone abnormality.   IMPRESSION: 1. Acute interstitial/edematous pancreatitis. No evidence of pancreatic necrosis or loculated peripancreatic fluid collections. 2. Mild hepatic steatosis. 3. Cholelithiasis. 4. Minimal right nonobstructing nephrolithiasis. 5. 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     Electronically Signed   By: Helyn Numbers M.D.   On: 03/05/2023 03:20  EXAM: MRI ABDOMEN WITHOUT AND WITH  CONTRAST (INCLUDING MRCP)   TECHNIQUE: Multiplanar multisequence MR imaging of the abdomen was performed both before and after the administration of intravenous contrast. Heavily T2-weighted images of the biliary and pancreatic ducts were obtained, and three-dimensional MRCP images were rendered by post processing.   CONTRAST:  6mL GADAVIST GADOBUTROL 1 MMOL/ML IV SOLN   COMPARISON:  U/S abdomen limited 03/05/2023 and CT AP from 03/05/2023   FINDINGS: Lower chest: No acute abnormality.   Hepatobiliary: There is diffuse hepatic steatosis. No suspicious enhancing liver lesion.   Gallstones. The majority of these are tiny measuring 2-3 mm or less. There is a dominant stone measuring 1.2 cm. No gallbladder wall thickening. Increased caliber of the common bile duct measures 8 mm, image 18/3. Abrupt narrowing of the distal common bile duct is identified just before the ampulla. No convincing evidence for choledocholithiasis.   Pancreas: There is diffuse pancreatic edema with peripancreatic inflammatory fat stranding with fluid tracking into bilateral retroperitoneum. No signs of pancreatic necrosis or pseudocyst formation.   Spleen:  Within normal limits in size and appearance.   Adrenals/Urinary Tract: Normal adrenal glands. Bilateral renal sinus cysts are identified. No follow-up imaging recommended. No suspicious kidney mass or signs of hydronephrosis.   Stomach/Bowel: Stomach appears normal. Mild wall thickening involving the duodenum likely reflects secondary inflammation. No pathologic dilatation of the large or small bowel loops.   Vascular/Lymphatic: Normal caliber abdominal aorta. The upper abdominal vascularity appears patent. No adenopathy.   Other:  None   Musculoskeletal: No suspicious bone lesions identified.   IMPRESSION: 1. Imaging findings compatible with acute pancreatitis. No signs of pancreatic necrosis or pseudocyst formation. 2. Gallstones. 3.  Increased caliber of the common bile duct measures 8 mm. Abrupt narrowing of the distal common bile duct is identified just before the ampulla. No convincing evidence for choledocholithiasis. 4. Hepatic steatosis.     Electronically Signed   By: Signa Kell M.D.   On: 03/05/2023 05:10  Assessment/Plan:  67 y.o. female with gallstone pancreatitis, complicated by pertinent comorbidities including GERD.  Patient admitted with gallstone pancreatitis.  Agree with current medical management with IV fluid and pain management -Discussed with patient the recommendation of cholecystectomy after resolution of pancreatitis.  I discussed with patient that cholecystectomy is no treatment for her acute pancreatitis.  The goal of cholecystectomy is to prevent recurrent episodes.  Discussed with patient the risk of surgery including bleeding, infection, bile leak, injury to common bile duct, possible need of cholangiogram, among others.  The patient reports  she understood and agreed to proceed. -Continue medical management of pancreatitis.  Will take patient to the OR for cholecystectomy after resolution of proctitis.  Gae Gallop, MD

## 2023-03-05 NOTE — Progress Notes (Signed)
  Progress Note   Patient: Linda Ritter ZOX:[id number] DOB: 1956/05/20 DOA: 03/05/2023     0 DOS: the patient was seen and examined on 03/05/2023   Brief hospital course: Taken from H&P.   Pattiann Solanki is a 67 y.o. female with No significant past medical history and no history of prior abdominal surgeries who presents to the ED with 1 day history of abdominal pain, nausea and nonbloody nonbilious vomiting not responding to over-the-counter omeprazole.   ED course and data review: Vitals within normal limits.  Labs significant for WBC of 14,000, lipase of 1316.  AST 163 and ALT 118. Patient had extensive imaging that included CT abdomen and pelvis with contrast, right upper quadrant ultrasound and ultimately MRCP, which showed no convincing evidence of choledocholithiasis as follows: IMPRESSION: 1. Imaging findings compatible with acute pancreatitis. No signs of pancreatic necrosis or pseudocyst formation. 2. Gallstones. 3. Increased caliber of the common bile duct measures 8 mm. Abrupt narrowing of the distal common bile duct is identified just before the ampulla. No convincing evidence for choledocholithiasis. 4. Hepatic steatosis.  General surgery was consulted.  6/9: Vitals stable.  Worsening leukocytosis at 16.5.  UA with mild protein urea only. Lipid panel normal with triglyceride of 19 only.  Continue to have pain. Surgery is recommending doing cholecystectomy after improving pancreatitis.    Assessment and Plan: * Acute gallstone pancreatitis Cholelithiasis without cholecystitis or choledocholithiasis Hepatic steatosis - MRCP showing findings compatible with acute pancreatitis. ... Gallstones.Marland KitchenMarland KitchenMarland KitchenNo convincing evidence for choledocholithiasis.Marland KitchenMarland KitchenMarland KitchenHepatic steatosis.  Lipid panel normal. -Mildly elevated LFTs, possibly passed gallstone - Clear liquid diet as tolerated - IV hydration, IV pain meds, IV antiemetics - Surgical consult to evaluate for cholecystectomy when  improved  Elevated LFTs Likely secondary to pancreatitis, may be recently passed gallstone. -Continue to monitor   Subjective: Patient was seen and examined today.  Continues to have significant epigastrium and left upper quadrant pain.  No current nausea or vomiting.  Poor p.o. intake and appetite  Physical Exam: Vitals:   03/04/23 2349 03/05/23 0745 03/05/23 0828  BP: 117/76 116/86 124/71  Pulse: 70 65 60  Resp: 18 16 16   Temp: 98.4 F (36.9 C) 98.2 F (36.8 C) 97.8 F (36.6 C)  TempSrc: Oral Oral   SpO2: 100% 97% 99%  Weight: 64.2 kg    Height: 5\' 1"  (1.549 m)     General.  Well-developed lady, in no acute distress. Pulmonary.  Lungs clear bilaterally, normal respiratory effort. CV.  Regular rate and rhythm, no JVD, rub or murmur. Abdomen.  Soft, mild epigastric tenderness, nondistended, BS positive. CNS.  Alert and oriented .  No focal neurologic deficit. Extremities.  No edema, no cyanosis, pulses intact and symmetrical. Psychiatry.  Judgment and insight appears normal.   Data Reviewed: Prior data reviewed  Family Communication: Discussed with daughter at bedside  Disposition: Status is: Inpatient Remains inpatient appropriate because: Severity of illness  Planned Discharge Destination: Home  DVT prophylaxis.  Lovenox Time spent:  minutes  This record has been created using Conservation officer, historic buildings. Errors have been sought and corrected,but may not always be located. Such creation errors do not reflect on the standard of care.   Author: Arnetha Courser, MD 03/05/2023 1:37 PM  For on call review [email or web address].

## 2023-03-05 NOTE — H&P (Signed)
History and Physical    Patient: Linda Ritter [id number]:[id number] DOB: 1956/08/20 DOA: 03/05/2023 DOS: the patient was seen and examined on 03/05/2023 PCP: Jenell Milliner, MD  Patient coming from: Home  Chief Complaint:  Chief Complaint  Patient presents with   Abdominal Pain    HPI: Linda Ritter is a 67 y.o. female with medical history significant for No significant past medical history and no history of prior abdominal surgeries who presents to the ED with 1 day history of abdominal pain, nausea and nonbloody nonbilious vomiting not responding to over-the-counter omeprazole.  She denies change in bowel habits, dysuria, fever or chills and denies chest pain, cough, fever or chills. ED course and data review: Vitals within normal limits.  Labs significant for WBC of 14,000, lipase of 1316.  AST 163 and ALT 118. Patient had extensive imaging that included CT abdomen and pelvis with contrast, right upper quadrant ultrasound and ultimately MRCP, which showed no convincing evidence of choledocholithiasis as follows: IMPRESSION: 1. Imaging findings compatible with acute pancreatitis. No signs of pancreatic necrosis or pseudocyst formation. 2. Gallstones. 3. Increased caliber of the common bile duct measures 8 mm. Abrupt narrowing of the distal common bile duct is identified just before the ampulla. No convincing evidence for choledocholithiasis. 4. Hepatic steatosis.  Patient treated with LR, Zofran, morphine. Hospitalist consulted for admission.   Review of Systems: As mentioned in the history of present illness. All other systems reviewed and are negative.  History reviewed. No pertinent past medical history. Past Surgical History:  Procedure Laterality Date   FRACTURE SURGERY Right    wrist   OOPHORECTOMY Right    Social History:  reports that she has never smoked. She has never used smokeless tobacco. She reports that she does not drink alcohol and does not use drugs.  No  Known Allergies  Family History  Problem Relation Age of Onset   Breast cancer Neg Hx     Prior to Admission medications   Medication Sig Start Date End Date Taking? Authorizing Provider  Multiple Vitamin (MULTIVITAMIN) tablet Take 1 tablet by mouth daily.    [provider]  sertraline (ZOLOFT) 25 MG tablet Take 25 mg by mouth daily. 03/11/21   [provider]    Physical Exam: Vitals:   03/04/23 2349  BP: 117/76  Pulse: 70  Resp: 18  Temp: 98.4 F (36.9 C)  TempSrc: Oral  SpO2: 100%  Weight: 64.2 kg  Height: 5\' 1"  (1.549 m)   Physical Exam Vitals and nursing note reviewed.  Constitutional:      General: She is not in acute distress. HENT:     Head: Normocephalic and atraumatic.  Cardiovascular:     Rate and Rhythm: Normal rate and regular rhythm.     Heart sounds: Normal heart sounds.  Pulmonary:     Effort: Pulmonary effort is normal.     Breath sounds: Normal breath sounds.  Abdominal:     Palpations: Abdomen is soft.     Tenderness: There is abdominal tenderness in the right upper quadrant and epigastric area.  Neurological:     Mental Status: Mental status is at baseline.     Labs on Admission: I have personally reviewed following labs and imaging studies  CBC: Recent Labs  Lab 03/04/23 2352  WBC 14.0*  HGB 14.7  HCT 45.0  MCV 92.0  PLT 330   Basic Metabolic Panel: Recent Labs  Lab 03/04/23 2352  NA 138  K 4.3  CL 102  CO2  23  GLUCOSE 167*  BUN 26*  CREATININE 0.94  CALCIUM 9.2   GFR: Estimated Creatinine Clearance: 50.6 mL/min (by C-G formula based on SCr of 0.94 mg/dL). Liver Function Tests: Recent Labs  Lab 03/04/23 2352  AST 163*  ALT 118*  ALKPHOS 87  BILITOT 1.1  PROT 7.6  ALBUMIN 4.6   Recent Labs  Lab 03/04/23 2352  LIPASE 1,316*   No results for input(s): "AMMONIA" in the last 168 hours. Coagulation Profile: No results for input(s): "INR", "PROTIME" in the last 168 hours. Cardiac Enzymes: No  results for input(s): "CKTOTAL", "CKMB", "CKMBINDEX", "TROPONINI" in the last 168 hours. BNP (last 3 results) No results for input(s): "PROBNP" in the last 8760 hours. HbA1C: No results for input(s): "HGBA1C" in the last 72 hours. CBG: No results for input(s): "GLUCAP" in the last 168 hours. Lipid Profile: No results for input(s): "CHOL", "HDL", "LDLCALC", "TRIG", "CHOLHDL", "LDLDIRECT" in the last 72 hours. Thyroid Function Tests: No results for input(s): "TSH", "T4TOTAL", "FREET4", "T3FREE", "THYROIDAB" in the last 72 hours. Anemia Panel: No results for input(s): "VITAMINB12", "FOLATE", "FERRITIN", "TIBC", "IRON", "RETICCTPCT" in the last 72 hours. Urine analysis:    Component Value Date/Time   COLORURINE AMBER (A) 03/04/2023 2352   APPEARANCEUR HAZY (A) 03/04/2023 2352   LABSPEC 1.035 (H) 03/04/2023 2352   PHURINE 5.0 03/04/2023 2352   GLUCOSEU NEGATIVE 03/04/2023 2352   HGBUR NEGATIVE 03/04/2023 2352   BILIRUBINUR NEGATIVE 03/04/2023 2352   KETONESUR NEGATIVE 03/04/2023 2352   PROTEINUR 30 (A) 03/04/2023 2352   NITRITE NEGATIVE 03/04/2023 2352   LEUKOCYTESUR NEGATIVE 03/04/2023 2352    Radiological Exams on Admission: MR ABDOMEN MRCP W WO CONTAST  Result Date: 03/05/2023 CLINICAL DATA:  Elevated LFTs and gallstone pancreatitis. Evaluate for common bile duct stone. EXAM: MRI ABDOMEN WITHOUT AND WITH CONTRAST (INCLUDING MRCP) TECHNIQUE: Multiplanar multisequence MR imaging of the abdomen was performed both before and after the administration of intravenous contrast. Heavily T2-weighted images of the biliary and pancreatic ducts were obtained, and three-dimensional MRCP images were rendered by post processing. CONTRAST:  6mL GADAVIST GADOBUTROL 1 MMOL/ML IV SOLN COMPARISON:  U/S abdomen limited 03/05/2023 and CT AP from 03/05/2023 FINDINGS: Lower chest: No acute abnormality. Hepatobiliary: There is diffuse hepatic steatosis. No suspicious enhancing liver lesion. Gallstones. The  majority of these are tiny measuring 2-3 mm or less. There is a dominant stone measuring 1.2 cm. No gallbladder wall thickening. Increased caliber of the common bile duct measures 8 mm, image 18/3. Abrupt narrowing of the distal common bile duct is identified just before the ampulla. No convincing evidence for choledocholithiasis. Pancreas: There is diffuse pancreatic edema with peripancreatic inflammatory fat stranding with fluid tracking into bilateral retroperitoneum. No signs of pancreatic necrosis or pseudocyst formation. Spleen:  Within normal limits in size and appearance. Adrenals/Urinary Tract: Normal adrenal glands. Bilateral renal sinus cysts are identified. No follow-up imaging recommended. No suspicious kidney mass or signs of hydronephrosis. Stomach/Bowel: Stomach appears normal. Mild wall thickening involving the duodenum likely reflects secondary inflammation. No pathologic dilatation of the large or small bowel loops. Vascular/Lymphatic: Normal caliber abdominal aorta. The upper abdominal vascularity appears patent. No adenopathy. Other:  None Musculoskeletal: No suspicious bone lesions identified. IMPRESSION: 1. Imaging findings compatible with acute pancreatitis. No signs of pancreatic necrosis or pseudocyst formation. 2. Gallstones. 3. Increased caliber of the common bile duct measures 8 mm. Abrupt narrowing of the distal common bile duct is identified just before the ampulla. No convincing evidence for choledocholithiasis. 4. Hepatic steatosis.  Electronically Signed   By: Signa Kell M.D.   On: 03/05/2023 05:10   MR 3D Recon At Scanner  Result Date: 03/05/2023 CLINICAL DATA:  Elevated LFTs and gallstone pancreatitis. Evaluate for common bile duct stone. EXAM: MRI ABDOMEN WITHOUT AND WITH CONTRAST (INCLUDING MRCP) TECHNIQUE: Multiplanar multisequence MR imaging of the abdomen was performed both before and after the administration of intravenous contrast. Heavily T2-weighted images of the  biliary and pancreatic ducts were obtained, and three-dimensional MRCP images were rendered by post processing. CONTRAST:  6mL GADAVIST GADOBUTROL 1 MMOL/ML IV SOLN COMPARISON:  U/S abdomen limited 03/05/2023 and CT AP from 03/05/2023 FINDINGS: Lower chest: No acute abnormality. Hepatobiliary: There is diffuse hepatic steatosis. No suspicious enhancing liver lesion. Gallstones. The majority of these are tiny measuring 2-3 mm or less. There is a dominant stone measuring 1.2 cm. No gallbladder wall thickening. Increased caliber of the common bile duct measures 8 mm, image 18/3. Abrupt narrowing of the distal common bile duct is identified just before the ampulla. No convincing evidence for choledocholithiasis. Pancreas: There is diffuse pancreatic edema with peripancreatic inflammatory fat stranding with fluid tracking into bilateral retroperitoneum. No signs of pancreatic necrosis or pseudocyst formation. Spleen:  Within normal limits in size and appearance. Adrenals/Urinary Tract: Normal adrenal glands. Bilateral renal sinus cysts are identified. No follow-up imaging recommended. No suspicious kidney mass or signs of hydronephrosis. Stomach/Bowel: Stomach appears normal. Mild wall thickening involving the duodenum likely reflects secondary inflammation. No pathologic dilatation of the large or small bowel loops. Vascular/Lymphatic: Normal caliber abdominal aorta. The upper abdominal vascularity appears patent. No adenopathy. Other:  None Musculoskeletal: No suspicious bone lesions identified. IMPRESSION: 1. Imaging findings compatible with acute pancreatitis. No signs of pancreatic necrosis or pseudocyst formation. 2. Gallstones. 3. Increased caliber of the common bile duct measures 8 mm. Abrupt narrowing of the distal common bile duct is identified just before the ampulla. No convincing evidence for choledocholithiasis. 4. Hepatic steatosis. Electronically Signed   By: Signa Kell M.D.   On: 03/05/2023 05:10    US Abdomen Limited RUQ (LIVER/GB)  Result Date: 03/05/2023 CLINICAL DATA:  Right upper quadrant abdominal pain EXAM: ULTRASOUND ABDOMEN LIMITED RIGHT UPPER QUADRANT COMPARISON:  None Available. FINDINGS: Gallbladder: Multiple layering gallstones are seen within the gallbladder. The gallbladder, however, is not distended, there is no gallbladder wall thickening, and no pericholecystic fluid is identified. The sonographic Eulah Pont sign is reportedly negative. Common bile duct: Diameter: 6 mm in mid diameter Liver: No focal lesion identified. Within normal limits in parenchymal echogenicity. Portal vein is patent on color Doppler imaging with normal direction of blood flow towards the liver. Other: None. IMPRESSION: 1. Cholelithiasis without sonographic evidence of acute cholecystitis. Electronically Signed   By: Helyn Numbers M.D.   On: 03/05/2023 03:35   CT ABDOMEN PELVIS W CONTRAST  Result Date: 03/05/2023 CLINICAL DATA:  Abdominal pain, acute, nonlocalized EXAM: CT ABDOMEN AND PELVIS WITH CONTRAST TECHNIQUE: Multidetector CT imaging of the abdomen and pelvis was performed using the standard protocol following bolus administration of intravenous contrast. RADIATION DOSE REDUCTION: This exam was performed according to the departmental dose-optimization program which includes automated exposure control, adjustment of the mA and/or kV according to patient size and/or use of iterative reconstruction technique. CONTRAST:  OMNIPAQUE IOHEXOL 300 MG/ML  SOLN COMPARISON:  None Available. FINDINGS: Lower chest: No acute abnormality. Hepatobiliary: Mild hepatic steatosis. No enhancing intrahepatic mass. No intra or extrahepatic biliary ductal dilation. Cholelithiasis without pericholecystic inflammatory change. Pancreas: There is moderate peripancreatic  inflammatory stranding with edema tracking into the greater omentum and anterior pararenal spaces bilaterally in keeping with changes of acute  interstitial/edematous pancreatitis. Inflammatory changes surround the entire pancreas. The pancreatic duct is not dilated. Normal enhancement of the pancreatic parenchyma; no evidence of parenchymal necrosis. No loculated peripancreatic fluid collections identified. Spleen: Normal in size without focal abnormality. Adrenals/Urinary Tract: The adrenal glands are unremarkable. 3 mm nonobstructing calculus noted within the upper pole of the right kidney. The kidneys are otherwise unremarkable. Bladder unremarkable. Stomach/Bowel: Mild ascites. Stomach, small bowel, and large bowel are unremarkable. Appendix normal. No free intraperitoneal gas. Vascular/Lymphatic: No significant vascular findings are present. No enlarged abdominal or pelvic lymph nodes. Reproductive: Uterus and bilateral adnexa are unremarkable. Other: No abdominal wall hernia Musculoskeletal: Bilateral L5 pars defects are present without associated spondylolisthesis. Degenerative changes are seen at the lumbosacral junction. No acute bone abnormality. IMPRESSION: 1. Acute interstitial/edematous pancreatitis. No evidence of pancreatic necrosis or loculated peripancreatic fluid collections. 2. Mild hepatic steatosis. 3. Cholelithiasis. 4. Minimal right nonobstructing nephrolithiasis. 5. 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 Electronically Signed   By: Helyn Numbers M.D.   On: 03/05/2023 03:20     Data Reviewed: Relevant notes from primary care and specialist visits, past discharge summaries as available in EHR, including Care Everywhere. Prior diagnostic testing as pertinent to current admission diagnoses Updated medications and problem lists for reconciliation ED course, including vitals, labs, imaging, treatment and response to treatment Triage notes, nursing and pharmacy notes and ED provider's notes Notable results as noted in HPI   Assessment and Plan: * Acute pancreatitis Cholelithiasis without  cholecystitis or choledocholithiasis Hepatic steatosis - MRCP showing findings compatible with acute pancreatitis. ... Gallstones.Marland KitchenMarland KitchenMarland KitchenNo convincing evidence for choledocholithiasis.Marland KitchenMarland KitchenMarland KitchenHepatic steatosis. -Mildly elevated LFTs, possibly passed gallstone - Clear liquid diet as tolerated - IV hydration, IV pain meds, IV antiemetics - Surgical consult to evaluate for cholecystectomy when improved     DVT prophylaxis: Lovenox  Consults: none  Advance Care Planning: full code  Family Communication: none  Disposition Plan: Back to previous home environment  Severity of Illness: The appropriate patient status for this patient is INPATIENT. Inpatient status is judged to be reasonable and necessary in order to provide the required intensity of service to ensure the patient's safety. The patient's presenting symptoms, physical exam findings, and initial radiographic and laboratory data in the context of their chronic comorbidities is felt to place them at high risk for further clinical deterioration. Furthermore, it is not anticipated that the patient will be medically stable for discharge from the hospital within 2 midnights of admission.   * I certify that at the point of admission it is my clinical judgment that the patient will require inpatient hospital care spanning beyond 2 midnights from the point of admission due to high intensity of service, high risk for further deterioration and high frequency of surveillance required.*  Author: Andris Baumann, MD 03/05/2023 5:36 AM  For on call review [email or web address].

## 2023-03-05 NOTE — Assessment & Plan Note (Addendum)
Cholelithiasis without cholecystitis or choledocholithiasis Hepatic steatosis - MRCP showing findings compatible with acute pancreatitis. ... Gallstones.Marland KitchenMarland KitchenMarland KitchenNo convincing evidence for choledocholithiasis.Marland KitchenMarland KitchenMarland KitchenHepatic steatosis.  Lipid panel normal. -Mildly elevated LFTs, possibly passed gallstone - Clear liquid diet as tolerated - IV hydration, IV pain meds, IV antiemetics - Surgical consult to evaluate for cholecystectomy when improved

## 2023-03-05 NOTE — Assessment & Plan Note (Signed)
Likely secondary to pancreatitis, may be recently passed gallstone. -Continue to monitor

## 2023-03-06 ENCOUNTER — Other Ambulatory Visit: Payer: Self-pay

## 2023-03-06 ENCOUNTER — Encounter: Payer: Self-pay | Admitting: Internal Medicine

## 2023-03-06 ENCOUNTER — Inpatient Hospital Stay: Payer: Medicare PPO | Admitting: Certified Registered"

## 2023-03-06 ENCOUNTER — Encounter
Admission: EM | Disposition: A | Discharge status: Home / Self Care | Payer: Self-pay | Source: Home / Self Care | Attending: Internal Medicine

## 2023-03-06 DIAGNOSIS — R7989 Other specified abnormal findings of blood chemistry: Secondary | ICD-10-CM

## 2023-03-06 DIAGNOSIS — K851 Biliary acute pancreatitis without necrosis or infection: Secondary | ICD-10-CM | POA: Diagnosis not present

## 2023-03-06 LAB — COMPREHENSIVE METABOLIC PANEL
ALT: 63 U/L — ABNORMAL HIGH (ref 0–44)
AST: 55 U/L — ABNORMAL HIGH (ref 15–41)
Albumin: 3 g/dL — ABNORMAL LOW (ref 3.5–5.0)
Alkaline Phosphatase: 57 U/L (ref 38–126)
Anion gap: 6 (ref 5–15)
BUN: 17 mg/dL (ref 8–23)
CO2: 26 mmol/L (ref 22–32)
Calcium: 8.1 mg/dL — ABNORMAL LOW (ref 8.9–10.3)
Chloride: 103 mmol/L (ref 98–111)
Creatinine, Ser: 0.75 mg/dL (ref 0.44–1.00)
GFR, Estimated: >60 mL/min (ref >60)
Glucose, Bld: 116 mg/dL — ABNORMAL HIGH (ref 70–99)
Potassium: 4 mmol/L (ref 3.5–5.1)
Sodium: 135 mmol/L (ref 135–145)
Total Bilirubin: 1.1 mg/dL (ref 0.3–1.2)
Total Protein: 5.6 g/dL — ABNORMAL LOW (ref 6.5–8.1)

## 2023-03-06 LAB — CBC
HCT: 37.6 % (ref 36.0–46.0)
Hemoglobin: 12.2 g/dL (ref 12.0–15.0)
MCH: 29.8 pg (ref 26.0–34.0)
MCHC: 32.4 g/dL (ref 30.0–36.0)
MCV: 91.9 fL (ref 80.0–100.0)
Platelets: 249 10*3/uL (ref 150–400)
RBC: 4.09 MIL/uL (ref 3.87–5.11)
RDW: 14.3 % (ref 11.5–15.5)
WBC: 17.6 10*3/uL — ABNORMAL HIGH (ref 4.0–10.5)
nRBC: 0 % (ref 0.0–0.2)

## 2023-03-06 LAB — HIV ANTIBODY (ROUTINE TESTING W REFLEX): HIV Screen 4th Generation wRfx: NONREACTIVE

## 2023-03-06 LAB — LIPASE, BLOOD: Lipase: 167 U/L — ABNORMAL HIGH (ref 11–51)

## 2023-03-06 SURGERY — CHOLECYSTECTOMY, ROBOT-ASSISTED, LAPAROSCOPIC
Anesthesia: General | Site: Abdomen

## 2023-03-06 MED ORDER — CEFAZOLIN SODIUM-DEXTROSE 2-4 GM/100ML-% IV SOLN
2.0000 g | Freq: Once | INTRAVENOUS | Status: AC
  Administered 2023-03-06: 2 g via INTRAVENOUS

## 2023-03-06 MED ORDER — INDOCYANINE GREEN 25 MG IV SOLR
INTRAVENOUS | Status: AC
  Filled 2023-03-06: qty 10

## 2023-03-06 MED ORDER — LIDOCAINE HCL (CARDIAC) PF 100 MG/5ML IV SOSY
PREFILLED_SYRINGE | INTRAVENOUS | Status: DC | PRN
  Administered 2023-03-06: 60 mg via INTRAVENOUS

## 2023-03-06 MED ORDER — HYDROMORPHONE HCL 1 MG/ML IJ SOLN
INTRAMUSCULAR | Status: DC | PRN
  Administered 2023-03-06: .5 mg via INTRAVENOUS

## 2023-03-06 MED ORDER — SUCCINYLCHOLINE CHLORIDE 200 MG/10ML IV SOSY
PREFILLED_SYRINGE | INTRAVENOUS | Status: AC
  Filled 2023-03-06: qty 10

## 2023-03-06 MED ORDER — EPINEPHRINE PF 1 MG/ML IJ SOLN
INTRAMUSCULAR | Status: AC
  Filled 2023-03-06: qty 1

## 2023-03-06 MED ORDER — FENTANYL CITRATE (PF) 100 MCG/2ML IJ SOLN
INTRAMUSCULAR | Status: DC | PRN
  Administered 2023-03-06 (×2): 50 ug via INTRAVENOUS

## 2023-03-06 MED ORDER — ACETAMINOPHEN 650 MG RE SUPP: 650.0000 mg | Freq: Four times a day (QID) | RECTAL | Status: DC | PRN

## 2023-03-06 MED ORDER — OXYCODONE HCL 5 MG/5ML PO SOLN: 5.0000 mg | Freq: Once | ORAL | Status: DC | PRN

## 2023-03-06 MED ORDER — SUGAMMADEX SODIUM 200 MG/2ML IV SOLN
INTRAVENOUS | Status: DC | PRN
  Administered 2023-03-06: 200 mg via INTRAVENOUS

## 2023-03-06 MED ORDER — BUPIVACAINE-EPINEPHRINE 0.25% -1:200000 IJ SOLN
INTRAMUSCULAR | Status: DC | PRN
  Administered 2023-03-06: 20 mL

## 2023-03-06 MED ORDER — INDOCYANINE GREEN 25 MG IV SOLR
1.2500 mg | Freq: Once | INTRAVENOUS | Status: AC
  Administered 2023-03-06: 1.25 mg via INTRAVENOUS
  Filled 2023-03-06: qty 10

## 2023-03-06 MED ORDER — BUPIVACAINE HCL (PF) 0.25 % IJ SOLN
INTRAMUSCULAR | Status: AC
  Filled 2023-03-06: qty 30

## 2023-03-06 MED ORDER — DEXMEDETOMIDINE HCL IN NACL 80 MCG/20ML IV SOLN
INTRAVENOUS | Status: DC | PRN
  Administered 2023-03-06 (×2): 8 ug via INTRAVENOUS
  Administered 2023-03-06: 4 ug via INTRAVENOUS

## 2023-03-06 MED ORDER — MIDAZOLAM HCL 2 MG/2ML IJ SOLN
INTRAMUSCULAR | Status: DC | PRN
  Administered 2023-03-06: 2 mg via INTRAVENOUS

## 2023-03-06 MED ORDER — OXYCODONE HCL 5 MG PO TABS: 5.0000 mg | ORAL_TABLET | Freq: Once | ORAL | Status: DC | PRN

## 2023-03-06 MED ORDER — SODIUM CHLORIDE 0.9 % IR SOLN
Status: DC | PRN
  Administered 2023-03-06: 1

## 2023-03-06 MED ORDER — ONDANSETRON HCL 4 MG/2ML IJ SOLN
INTRAMUSCULAR | Status: AC
  Filled 2023-03-06: qty 2

## 2023-03-06 MED ORDER — MIDAZOLAM HCL 2 MG/2ML IJ SOLN
INTRAMUSCULAR | Status: AC
  Filled 2023-03-06: qty 2

## 2023-03-06 MED ORDER — ACETAMINOPHEN 325 MG PO TABS
650.0000 mg | ORAL_TABLET | Freq: Four times a day (QID) | ORAL | Status: DC | PRN
  Administered 2023-03-07 (×2): 650 mg via ORAL
  Filled 2023-03-06 (×2): qty 2

## 2023-03-06 MED ORDER — HYDROMORPHONE HCL 1 MG/ML IJ SOLN
INTRAMUSCULAR | Status: AC
  Filled 2023-03-06: qty 1

## 2023-03-06 MED ORDER — SUCCINYLCHOLINE CHLORIDE 200 MG/10ML IV SOSY
PREFILLED_SYRINGE | INTRAVENOUS | Status: DC | PRN
  Administered 2023-03-06: 100 mg via INTRAVENOUS

## 2023-03-06 MED ORDER — ROCURONIUM BROMIDE 100 MG/10ML IV SOLN
INTRAVENOUS | Status: DC | PRN
  Administered 2023-03-06: 50 mg via INTRAVENOUS

## 2023-03-06 MED ORDER — KETOROLAC TROMETHAMINE 30 MG/ML IJ SOLN
INTRAMUSCULAR | Status: DC | PRN
  Administered 2023-03-06: 30 mg via INTRAVENOUS

## 2023-03-06 MED ORDER — DEXAMETHASONE SODIUM PHOSPHATE 10 MG/ML IJ SOLN
INTRAMUSCULAR | Status: AC
  Filled 2023-03-06: qty 1

## 2023-03-06 MED ORDER — FENTANYL CITRATE (PF) 100 MCG/2ML IJ SOLN: 25.0000 ug | INTRAMUSCULAR | Status: DC | PRN

## 2023-03-06 MED ORDER — CEFAZOLIN SODIUM-DEXTROSE 2-4 GM/100ML-% IV SOLN
INTRAVENOUS | Status: AC
  Filled 2023-03-06: qty 100

## 2023-03-06 MED ORDER — DEXAMETHASONE SODIUM PHOSPHATE 10 MG/ML IJ SOLN
INTRAMUSCULAR | Status: DC | PRN
  Administered 2023-03-06: 10 mg via INTRAVENOUS

## 2023-03-06 MED ORDER — PROPOFOL 10 MG/ML IV BOLUS
INTRAVENOUS | Status: AC
  Filled 2023-03-06: qty 40

## 2023-03-06 MED ORDER — KETOROLAC TROMETHAMINE 30 MG/ML IJ SOLN
INTRAMUSCULAR | Status: AC
  Filled 2023-03-06: qty 1

## 2023-03-06 MED ORDER — FENTANYL CITRATE (PF) 100 MCG/2ML IJ SOLN
INTRAMUSCULAR | Status: AC
  Filled 2023-03-06: qty 2

## 2023-03-06 MED ORDER — PROPOFOL 10 MG/ML IV BOLUS
INTRAVENOUS | Status: DC | PRN
  Administered 2023-03-06: 110 mg via INTRAVENOUS

## 2023-03-06 MED ORDER — LIDOCAINE HCL (PF) 2 % IJ SOLN
INTRAMUSCULAR | Status: AC
  Filled 2023-03-06: qty 5

## 2023-03-06 MED ORDER — ONDANSETRON HCL 4 MG/2ML IJ SOLN
INTRAMUSCULAR | Status: DC | PRN
  Administered 2023-03-06: 4 mg via INTRAVENOUS

## 2023-03-06 SURGICAL SUPPLY — 47 items
BAG PRESSURE INF REUSE 1000 (BAG) IMPLANT
BLADE SURG SZ11 CARB STEEL (BLADE) ×1 IMPLANT
CANNULA REDUCER 12-8 DVNC XI (CANNULA) ×1 IMPLANT
CATH REDDICK CHOLANGI 4FR 50CM (CATHETERS) IMPLANT
CAUTERY HOOK MNPLR 1.6 DVNC XI (INSTRUMENTS) ×1 IMPLANT
CLIP LIGATING HEM O LOK PURPLE (MISCELLANEOUS) IMPLANT
CLIP LIGATING HEMO O LOK GREEN (MISCELLANEOUS) ×1 IMPLANT
DERMABOND ADVANCED .7 DNX12 (GAUZE/BANDAGES/DRESSINGS) ×1 IMPLANT
DRAPE ARM DVNC X/XI (DISPOSABLE) ×4 IMPLANT
DRAPE C-ARM XRAY 36X54 (DRAPES) IMPLANT
DRAPE COLUMN DVNC XI (DISPOSABLE) ×1 IMPLANT
ELECT REM PT RETURN 9FT ADLT (ELECTROSURGICAL) ×1
ELECTRODE REM PT RTRN 9FT ADLT (ELECTROSURGICAL) ×1 IMPLANT
FORCEPS BPLR 8 MD DVNC XI (FORCEP) ×1 IMPLANT
FORCEPS BPLR R/ABLATION 8 DVNC (INSTRUMENTS) ×1 IMPLANT
FORCEPS PROGRASP DVNC XI (FORCEP) ×1 IMPLANT
GLOVE BIO SURGEON STRL SZ 6.5 (GLOVE) ×2 IMPLANT
GLOVE BIOGEL PI IND STRL 6.5 (GLOVE) ×2 IMPLANT
GOWN STRL REUS W/ TWL LRG LVL3 (GOWN DISPOSABLE) ×3 IMPLANT
GOWN STRL REUS W/TWL LRG LVL3 (GOWN DISPOSABLE) ×3
GRASPER SUT TROCAR 14GX15 (MISCELLANEOUS) ×1 IMPLANT
IRRIGATOR SUCT 8 DISP DVNC XI (IRRIGATION / IRRIGATOR) IMPLANT
IV CATH ANGIO 12GX3 LT BLUE (NEEDLE) IMPLANT
IV NS 1000ML (IV SOLUTION) ×1
IV NS 1000ML BAXH (IV SOLUTION) IMPLANT
KIT PINK PAD W/HEAD ARE REST (MISCELLANEOUS) ×1 IMPLANT
KIT PINK PAD W/HEAD ARM REST (MISCELLANEOUS) ×1 IMPLANT
LABEL OR SOLS (LABEL) ×1 IMPLANT
MANIFOLD NEPTUNE II (INSTRUMENTS) ×1 IMPLANT
NDL HYPO 22X1.5 SAFETY MO (MISCELLANEOUS) ×1 IMPLANT
NDL INSUFFLATION 14GA 120MM (NEEDLE) ×1 IMPLANT
NEEDLE HYPO 22X1.5 SAFETY MO (MISCELLANEOUS) ×1 IMPLANT
NEEDLE INSUFFLATION 14GA 120MM (NEEDLE) ×1 IMPLANT
NS IRRIG 500ML POUR BTL (IV SOLUTION) ×1 IMPLANT
OBTURATOR OPTICAL STND 8 DVNC (TROCAR) ×1
OBTURATOR OPTICALSTD 8 DVNC (TROCAR) ×1 IMPLANT
PACK LAP CHOLECYSTECTOMY (MISCELLANEOUS) ×1 IMPLANT
SEAL UNIV 5-12 XI (MISCELLANEOUS) ×4 IMPLANT
SET TUBE SMOKE EVAC HIGH FLOW (TUBING) ×1 IMPLANT
SOL ELECTROSURG ANTI STICK (MISCELLANEOUS) ×1
SOLUTION ELECTROSURG ANTI STCK (MISCELLANEOUS) ×1 IMPLANT
SUT MNCRL 4-0 (SUTURE) ×1
SUT MNCRL 4-0 27XMFL (SUTURE) ×1
SUT VICRYL 0 UR6 27IN ABS (SUTURE) ×1 IMPLANT
SUTURE MNCRL 4-0 27XMF (SUTURE) ×1 IMPLANT
SYS BAG RETRIEVAL 10MM (BASKET) ×1
SYSTEM BAG RETRIEVAL 10MM (BASKET) ×1 IMPLANT

## 2023-03-06 NOTE — Progress Notes (Signed)
  Progress Note   Patient: Linda Ritter EPP:[id number] DOB: [birth year] DOA: 03/05/2023     1 DOS: the patient was seen and examined on 03/06/2023   Brief hospital course: Taken from H&P.   Linda Ritter is a 67 y.o. female with No significant past medical history and no history of prior abdominal surgeries who presents to the ED with 1 day history of abdominal pain, nausea and nonbloody nonbilious vomiting not responding to over-the-counter omeprazole.   ED course and data review: Vitals within normal limits.  Labs significant for WBC of 14,000, lipase of 1316.  AST 163 and ALT 118. Patient had extensive imaging that included CT abdomen and pelvis with contrast, right upper quadrant ultrasound and ultimately MRCP, which showed no convincing evidence of choledocholithiasis as follows: IMPRESSION: 1. Imaging findings compatible with acute pancreatitis. No signs of pancreatic necrosis or pseudocyst formation. 2. Gallstones. 3. Increased caliber of the common bile duct measures 8 mm. Abrupt narrowing of the distal common bile duct is identified just before the ampulla. No convincing evidence for choledocholithiasis. 4. Hepatic steatosis.  General surgery was consulted.  6/9: Vitals stable.  Worsening leukocytosis at 16.5.  UA with mild protein urea only. Lipid panel normal with triglyceride of 19 only.  Continue to have pain. Surgery is recommending doing cholecystectomy after improving pancreatitis.  6/10: Hemodynamically stable.  Clinically improving with significant improvement in lipase to 167 from 1316.  Transaminitis started improving.  Surgery decided to take her to the OR today for robotic cholecystectomy.   Assessment and Plan: * Acute gallstone pancreatitis Cholelithiasis without cholecystitis or choledocholithiasis Hepatic steatosis - MRCP showing findings compatible with acute pancreatitis. ... Gallstones.Marland KitchenMarland KitchenMarland KitchenNo convincing evidence for choledocholithiasis.Marland KitchenMarland KitchenMarland KitchenHepatic  steatosis.  Lipid panel normal. -Mildly elevated LFTs, possibly passed gallstone - Clear liquid diet as tolerated - IV hydration, IV pain meds, IV antiemetics - Going for robotic cholecystectomy today  Elevated LFTs Likely secondary to pancreatitis, may be recently passed gallstone. Started improving -Continue to monitor   Subjective: Patient with much improved abdominal pain.  No nausea or vomiting.  Going for robotic cholecystectomy today  Physical Exam: Vitals:   03/06/23 1300 03/06/23 1324 03/06/23 1326 03/06/23 1539  BP: 105/61 96/69  102/60  Pulse: 74 68  72  Resp: 17 17  16   Temp: 98.1 F (36.7 C)   97.9 F (36.6 C)  TempSrc:      SpO2: 95% (!) 88% 94% 92%  Weight:      Height:       General.  Well-developed lady, in no acute distress. Pulmonary.  Lungs clear bilaterally, normal respiratory effort. CV.  Regular rate and rhythm, no JVD, rub or murmur. Abdomen.  Soft, nontender, nondistended, BS positive. CNS.  Alert and oriented .  No focal neurologic deficit. Extremities.  No edema, no cyanosis, pulses intact and symmetrical. Psychiatry.  Judgment and insight appears normal. .   Data Reviewed: Prior data reviewed  Family Communication: Husband at bedside  Disposition: Status is: Inpatient Remains inpatient appropriate because: Severity of illness  Planned Discharge Destination: Home  DVT prophylaxis.  Lovenox Time spent: 40 minutes  This record has been created using Conservation officer, historic buildings. Errors have been sought and corrected,but may not always be located. Such creation errors do not reflect on the standard of care.   Author: Arnetha Courser, MD 03/06/2023 3:41 PM  For on call review [email or web address].

## 2023-03-06 NOTE — Assessment & Plan Note (Signed)
Likely secondary to pancreatitis, may be recently passed gallstone. Started improving -Continue to monitor

## 2023-03-06 NOTE — Transfer of Care (Signed)
Immediate Anesthesia Transfer of Care Note  Patient: Lawana Hartzell  Procedure(s) Performed: XI ROBOTIC ASSISTED LAPAROSCOPIC CHOLECYSTECTOMY (Abdomen) INDOCYANINE Kathalina Ostermann FLUORESCENCE IMAGING (ICG) (Abdomen)  Patient Location: PACU  Anesthesia Type:General  Level of Consciousness: drowsy  Airway & Oxygen Therapy: Patient Spontanous Breathing and Patient connected to face mask oxygen  Post-op Assessment: Report given to RN, Post -op Vital signs reviewed and stable, and Patient moving all extremities  Post vital signs: Reviewed and stable  Last Vitals:  Vitals Value Taken Time  BP 93/60 03/06/23 1222  Temp    Pulse 67 03/06/23 1224  Resp 11 03/06/23 1224  SpO2 97 % 03/06/23 1224  Vitals shown include unvalidated device data.  Last Pain:  Vitals:   03/06/23 1008  TempSrc: Temporal  PainSc: 4          Complications: No notable events documented.

## 2023-03-06 NOTE — Assessment & Plan Note (Signed)
Cholelithiasis without cholecystitis or choledocholithiasis Hepatic steatosis - MRCP showing findings compatible with acute pancreatitis. ... Gallstones.Marland KitchenMarland KitchenMarland KitchenNo convincing evidence for choledocholithiasis.Marland KitchenMarland KitchenMarland KitchenHepatic steatosis.  Lipid panel normal. -Mildly elevated LFTs, possibly passed gallstone - Clear liquid diet as tolerated - IV hydration, IV pain meds, IV antiemetics - Going for robotic cholecystectomy today

## 2023-03-06 NOTE — Anesthesia Preprocedure Evaluation (Signed)
Anesthesia Evaluation  Patient identified by MRN, date of birth, ID band Patient awake    Reviewed: Allergy & Precautions, NPO status , Patient's Chart, lab work & pertinent test results  History of Anesthesia Complications Negative for: history of anesthetic complications  Airway Mallampati: III  TM Distance: <3 FB Neck ROM: full    Dental  (+) Chipped   Pulmonary neg pulmonary ROS, neg shortness of breath   Pulmonary exam normal        Cardiovascular (-) angina (-) Past MI negative cardio ROS Normal cardiovascular exam     Neuro/Psych negative neurological ROS  negative psych ROS   GI/Hepatic Neg liver ROS,GERD  Controlled,,  Endo/Other  negative endocrine ROS    Renal/GU      Musculoskeletal   Abdominal   Peds  Hematology negative hematology ROS (+)   Anesthesia Other Findings History reviewed. No pertinent past medical history.  Past Surgical History: No date: FRACTURE SURGERY; Right     Comment:  wrist No date: OOPHORECTOMY; Right  BMI    Body Mass Index: 25.13 kg/m      Reproductive/Obstetrics negative OB ROS                             Anesthesia Physical Anesthesia Plan  ASA: 2  Anesthesia Plan: General ETT   Post-op Pain Management:    Induction: Intravenous  PONV Risk Score and Plan: Ondansetron, Dexamethasone, Midazolam and Treatment may vary due to age or medical condition  Airway Management Planned: Oral ETT  Additional Equipment:   Intra-op Plan:   Post-operative Plan: Extubation in OR  Informed Consent: I have reviewed the patients History and Physical, chart, labs and discussed the procedure including the risks, benefits and alternatives for the proposed anesthesia with the patient or authorized representative who has indicated his/her understanding and acceptance.     Dental Advisory Given  Plan Discussed with: Anesthesiologist, CRNA and  Surgeon  Anesthesia Plan Comments: (Patient consented for risks of anesthesia including but not limited to:  - adverse reactions to medications - damage to eyes, teeth, lips or other oral mucosa - nerve damage due to positioning  - sore throat or hoarseness - Damage to heart, brain, nerves, lungs, other parts of body or loss of life  Patient voiced understanding.)       Anesthesia Quick Evaluation

## 2023-03-06 NOTE — Op Note (Signed)
Preoperative diagnosis: Biliary pancreatitis  Postoperative diagnosis: Biliary pancreatitis                                             Intra-abdominal lesions  Procedure: Robotic Assisted Laparoscopic Cholecystectomy.                      Robotic assisted laparoscopic omental biopsy  Anesthesia: GETA   Surgeon: Dr. Hazle Quant  Wound Classification: Clean Contaminated  Indications: Patient is a 67 y.o. female developed epigastric pain and on workup was found to have cholelithiasis with a normal common duct and biliary pancreatitis. Robotic Assisted Laparoscopic cholecystectomy was elected.  Findings: -Abundant amount of white small lesions were identified in the abdominal wall, diaphragm, omentum, falciform ligament -Critical view of safety achieved -Cystic duct and artery identified, ligated and divided -Adequate hemostasis  Description of procedure: The patient was placed on the operating table in the supine position. General anesthesia was induced. A time-out was completed verifying correct patient, procedure, site, positioning, and implant(s) and/or special equipment prior to beginning this procedure. An orogastric tube was placed. The abdomen was prepped and draped in the usual sterile fashion.  An incision was made in a natural skin line below the umbilicus.  The fascia was elevated and the Veress needle inserted. Proper position was confirmed by aspiration and saline meniscus test.  The abdomen was insufflated with carbon dioxide to a pressure of 15 mmHg. The patient tolerated insufflation well. A 8-mm trocar was then inserted in optiview fashion.  The laparoscope was inserted and the abdomen inspected.   No injuries from initial trocar placement were noted. Additional trocars were then inserted in the following locations: an 8-mm trocar in the left lateral abdomen, and another two 8-mm trocars to the right side of the abdomen 5 cm appart. The umbilical trocar was changed to a 12  mm trocar all under direct visualization. The abdomen was inspected and there was significant amount of ascites and abundant amount of small white lesions throughout the abdominal cavity.  It was decided to get a laparoscopic omental biopsy.  Frozen section showed fat necrosis but no sign of malignancy.  An additional omental biopsy was done for permanent pathology.  The table was placed in the reverse Trendelenburg position with the right side up. The robotic arms were docked and target anatomy identified. Instrument inserted under direct visualization.  Filmy adhesions between the gallbladder and omentum, duodenum and transverse colon were lysed with electrocautery. The dome of the gallbladder was grasped with a prograsp and retracted over the dome of the liver. The infundibulum was also grasped with an atraumatic grasper and retracted toward the right lower quadrant. This maneuver exposed Calot's triangle. The peritoneum overlying the gallbladder infundibulum was then incised and the cystic duct and cystic artery identified and circumferentially dissected. Critical view of safety reviewed before ligating any structure. Firefly images taken to visualize biliary ducts. The cystic duct and cystic artery were then doubly clipped and divided close to the gallbladder.  The gallbladder was then dissected from its peritoneal attachments by electrocautery. Hemostasis was checked and the gallbladder and contained stones were removed using an endoscopic retrieval bag. The gallbladder was passed off the table as a specimen. The gallbladder fossa was copiously irrigated with saline and hemostasis was obtained. There was no evidence of bleeding from the gallbladder fossa or  cystic artery or leakage of the bile from the cystic duct stump. Secondary trocars were removed under direct vision. No bleeding was noted. The robotic arms were undoked. The scope was withdrawn and the umbilical trocar removed. The abdomen was allowed to  collapse. The fascia of the 12mm trocar sites was closed with figure-of-eight 0 vicryl sutures. The skin was closed with subcuticular sutures of 4-0 monocryl and topical skin adhesive. The orogastric tube was removed.  The patient tolerated the procedure well and was taken to the postanesthesia care unit in stable condition.   Specimen: Gallbladder  Complications: None  EBL: 10 mL

## 2023-03-06 NOTE — Progress Notes (Signed)
Patient ID: Linda Ritter, female   DOB: 1956-08-04, 67 y.o.   MRN: [id number]     SURGICAL PROGRESS NOTE   Hospital Day(s): 1.   Interval History: Patient seen and examined, no acute events or new complaints overnight. Patient reports feeling better this morning.  She endorses that the abdominal pain has significantly improved.  She denies any nausea or vomiting.  No pain radiation.  No alleviating or aggravating factors.  Vital signs in last 24 hours: [min-max] current  Temp:  [97.8 F (36.6 C)-100.6 F (38.1 C)] 98.5 F (36.9 C) (06/10 0732) Pulse Rate:  [60-84] 73 (06/10 0732) Resp:  [16-18] 17 (06/10 0732) BP: (106-124)/(51-71) 111/55 (06/10 0732) SpO2:  [87 %-99 %] 87 % (06/10 0732)     Height: 5\' 1"  (154.9 cm) Weight: 64.2 kg BMI (Calculated): 26.76   Physical Exam:  Constitutional: alert, cooperative and no distress  Respiratory: breathing non-labored at rest  Cardiovascular: regular rate and sinus rhythm  Gastrointestinal: soft, non-tender, and non-distended  Labs:     Latest Ref Rng & Units 03/06/2023    4:58 AM 03/05/2023    6:44 AM 03/04/2023   11:52 PM  CBC  WBC 4.0 - 10.5 K/uL 17.6  16.5  14.0   Hemoglobin 12.0 - 15.0 g/dL 11.9  14.7  82.9   Hematocrit 36.0 - 46.0 % 37.6  43.7  45.0   Platelets 150 - 400 K/uL 249  271  330       Latest Ref Rng & Units 03/06/2023    4:58 AM 03/05/2023    6:44 AM 03/04/2023   11:52 PM  CMP  Glucose 70 - 99 mg/dL 562   130   BUN 8 - 23 mg/dL 17   26   Creatinine 8.65 - 1.00 mg/dL 7.84  6.96  2.95   Sodium 135 - 145 mmol/L 135   138   Potassium 3.5 - 5.1 mmol/L 4.0   4.3   Chloride 98 - 111 mmol/L 103   102   CO2 22 - 32 mmol/L 26   23   Calcium 8.9 - 10.3 mg/dL 8.1   9.2   Total Protein 6.5 - 8.1 g/dL 5.6   7.6   Total Bilirubin 0.3 - 1.2 mg/dL 1.1   1.1   Alkaline Phos 38 - 126 U/L 57   87   AST 15 - 41 U/L 55   163   ALT 0 - 44 U/L 63   118     Imaging studies: No new pertinent imaging studies   Assessment/Plan:  66  y.o. female with gallstone pancreatitis, complicated by pertinent comorbidities including GERD.   -Significant improvement in abdominal pain.  Lipase decreased to 167 from 1316. -Discussed with patient again recommendation of cholecystectomy.  Discussed with patient again the risks of surgery including bleeding, infection, biliary leak, injury to common bile duct, injury to adjacent organ, among others.  The patient reports she understood and agreed to proceed with robotic cholecystectomy today.  Gae Gallop, MD

## 2023-03-06 NOTE — Anesthesia Postprocedure Evaluation (Signed)
Anesthesia Post Note  Patient: Jazarah Capili  Procedure(s) Performed: XI ROBOTIC ASSISTED LAPAROSCOPIC CHOLECYSTECTOMY (Abdomen) INDOCYANINE GREEN FLUORESCENCE IMAGING (ICG) (Abdomen)  Patient location during evaluation: PACU Anesthesia Type: General Level of consciousness: awake and alert Pain management: pain level controlled Vital Signs Assessment: post-procedure vital signs reviewed and stable Respiratory status: spontaneous breathing, nonlabored ventilation, respiratory function stable and patient connected to nasal cannula oxygen Cardiovascular status: blood pressure returned to baseline and stable Postop Assessment: no apparent nausea or vomiting Anesthetic complications: no   No notable events documented.   Last Vitals:  Vitals:   03/06/23 1300 03/06/23 1324  BP: 105/61 96/69  Pulse: 74 68  Resp: 17 17  Temp: 36.7 C   SpO2: 95% (!) 88%    Last Pain:  Vitals:   03/06/23 1300  TempSrc:   PainSc: 0-No pain                 Cleda Mccreedy Zarya Lasseigne

## 2023-03-06 NOTE — Anesthesia Procedure Notes (Signed)
Procedure Name: Intubation Date/Time: 03/06/2023 10:49 AM  Performed by: Katherine Basset, CRNAPre-anesthesia Checklist: Patient identified, Emergency Drugs available, Suction available and Patient being monitored Patient Re-evaluated:Patient Re-evaluated prior to induction Oxygen Delivery Method: Circle system utilized Preoxygenation: Pre-oxygenation with 100% oxygen Induction Type: IV induction and Rapid sequence Laryngoscope Size: Miller and 2 Grade View: Grade I Tube type: Oral Tube size: 7.0 mm Number of attempts: 1 Airway Equipment and Method: Stylet, Oral airway and Bite block Placement Confirmation: ETT inserted through vocal cords under direct vision, positive ETCO2 and breath sounds checked- equal and bilateral Secured at: 21 cm Tube secured with: Tape Dental Injury: Teeth and Oropharynx as per pre-operative assessment

## 2023-03-07 DIAGNOSIS — R7989 Other specified abnormal findings of blood chemistry: Secondary | ICD-10-CM | POA: Diagnosis not present

## 2023-03-07 DIAGNOSIS — K851 Biliary acute pancreatitis without necrosis or infection: Secondary | ICD-10-CM | POA: Diagnosis not present

## 2023-03-07 LAB — COMPREHENSIVE METABOLIC PANEL
ALT: 54 U/L — ABNORMAL HIGH (ref 0–44)
AST: 56 U/L — ABNORMAL HIGH (ref 15–41)
Albumin: 2.6 g/dL — ABNORMAL LOW (ref 3.5–5.0)
Alkaline Phosphatase: 62 U/L (ref 38–126)
Anion gap: 6 (ref 5–15)
BUN: 17 mg/dL (ref 8–23)
CO2: 26 mmol/L (ref 22–32)
Calcium: 7.7 mg/dL — ABNORMAL LOW (ref 8.9–10.3)
Chloride: 104 mmol/L (ref 98–111)
Creatinine, Ser: 0.77 mg/dL (ref 0.44–1.00)
GFR, Estimated: >60 mL/min (ref >60)
Glucose, Bld: 101 mg/dL — ABNORMAL HIGH (ref 70–99)
Potassium: 3.8 mmol/L (ref 3.5–5.1)
Sodium: 136 mmol/L (ref 135–145)
Total Bilirubin: 0.6 mg/dL (ref 0.3–1.2)
Total Protein: 5.3 g/dL — ABNORMAL LOW (ref 6.5–8.1)

## 2023-03-07 LAB — SURGICAL PATHOLOGY

## 2023-03-07 LAB — CBC
HCT: 34.5 % — ABNORMAL LOW (ref 36.0–46.0)
Hemoglobin: 11.1 g/dL — ABNORMAL LOW (ref 12.0–15.0)
MCH: 29.7 pg (ref 26.0–34.0)
MCHC: 32.2 g/dL (ref 30.0–36.0)
MCV: 92.2 fL (ref 80.0–100.0)
Platelets: 214 10*3/uL (ref 150–400)
RBC: 3.74 MIL/uL — ABNORMAL LOW (ref 3.87–5.11)
RDW: 14.3 % (ref 11.5–15.5)
WBC: 14.4 10*3/uL — ABNORMAL HIGH (ref 4.0–10.5)
nRBC: 0 % (ref 0.0–0.2)

## 2023-03-07 MED ORDER — HYDROCODONE-ACETAMINOPHEN 5-325 MG PO TABS: 1.0000 | ORAL_TABLET | ORAL | 0 refills | Status: AC | PRN

## 2023-03-07 MED ORDER — ONDANSETRON HCL 4 MG PO TABS: 4.0000 mg | ORAL_TABLET | Freq: Four times a day (QID) | ORAL | 0 refills | Status: DC | PRN

## 2023-03-07 NOTE — Discharge Instructions (Addendum)

## 2023-03-07 NOTE — Discharge Summary (Signed)
Physician Discharge Summary   Patient: Linda Ritter MRN: [id number] DOB: [birth year]  Admit date:     03/05/2023  Discharge date: 03/07/23  Discharge Physician: Arnetha Courser   PCP: Jenell Milliner, MD   Recommendations at discharge:  Please obtain CBC and CMP within a week Follow-up with general surgery Follow-up with primary care provider  Discharge Diagnoses: Principal Problem:   Acute gallstone pancreatitis Active Problems:   Elevated LFTs   Hospital Course: Taken from H&P.   Linda Ritter is a 68 y.o. female with No significant past medical history and no history of prior abdominal surgeries who presents to the ED with 1 day history of abdominal pain, nausea and nonbloody nonbilious vomiting not responding to over-the-counter omeprazole.   ED course and data review: Vitals within normal limits.  Labs significant for WBC of 14,000, lipase of 1316.  AST 163 and ALT 118. Patient had extensive imaging that included CT abdomen and pelvis with contrast, right upper quadrant ultrasound and ultimately MRCP, which showed no convincing evidence of choledocholithiasis as follows: IMPRESSION: 1. Imaging findings compatible with acute pancreatitis. No signs of pancreatic necrosis or pseudocyst formation. 2. Gallstones. 3. Increased caliber of the common bile duct measures 8 mm. Abrupt narrowing of the distal common bile duct is identified just before the ampulla. No convincing evidence for choledocholithiasis. 4. Hepatic steatosis.  General surgery was consulted.  6/9: Vitals stable.  Worsening leukocytosis at 16.5.  UA with mild protein urea only. Lipid panel normal with triglyceride of 19 only.  Continue to have pain. Surgery is recommending doing cholecystectomy after improving pancreatitis.  6/10: Hemodynamically stable.  Clinically improving with significant improvement in lipase to 167 from 1316.  Transaminitis started improving.  Surgery decided to take her to the OR  today for robotic cholecystectomy.  6/11: Patient has successful robotic cholecystectomy yesterday.  Noted to have some white spots on omentum-biopsies were taken and general surgery will follow-up. Pain seems well-controlled except mild bloatedness.  Tolerating diet.  Surgery cleared her for discharge.  She was given some Norco and Zofran to be used as needed.  Postoperative instructions were provided in AVS.  Discussed with patient and husband at bedside.  Patient will continue on current medications and need to have a close follow-up with her providers for further recommendations.  Assessment and Plan: * Acute gallstone pancreatitis Cholelithiasis without cholecystitis or choledocholithiasis Hepatic steatosis - MRCP showing findings compatible with acute pancreatitis. ... Gallstones.Marland KitchenMarland KitchenMarland KitchenNo convincing evidence for choledocholithiasis.Marland KitchenMarland KitchenMarland KitchenHepatic steatosis.  Lipid panel normal. -Mildly elevated LFTs, possibly passed gallstone - Clear liquid diet as tolerated - IV hydration, IV pain meds, IV antiemetics - Going for robotic cholecystectomy today  Elevated LFTs Likely secondary to pancreatitis, may be recently passed gallstone. Started improving -Continue to monitor   Pain control - [employer] Controlled Substance Reporting System database was reviewed. and patient was instructed, not to drive, operate heavy machinery, perform activities at heights, swimming or participation in water activities or provide baby-sitting services while on Pain, Sleep and Anxiety Medications; until their outpatient Physician has advised to do so again. Also recommended to not to take more than prescribed Pain, Sleep and Anxiety Medications.  Consultants: General surgery Procedures performed: Robotic assisted cholecystectomy Disposition: Home Diet recommendation:  Discharge Diet Orders (From admission, onward)     Start     Ordered   03/07/23 0000  Diet - low sodium heart healthy        03/07/23 1038  Regular diet DISCHARGE MEDICATION: Allergies as of 03/07/2023   No Known Allergies      Medication List     TAKE these medications    HYDROcodone-acetaminophen 5-325 MG tablet Commonly known as: Norco Take 1 tablet by mouth every 4 (four) hours as needed for up to 3 days for moderate pain.   multivitamin tablet Take 1 tablet by mouth daily.   ondansetron 4 MG tablet Commonly known as: ZOFRAN Take 1 tablet (4 mg total) by mouth every 6 (six) hours as needed for nausea.   sertraline 25 MG tablet Commonly known as: ZOLOFT Take 25 mg by mouth daily.        Follow-up Information     Carolan Shiver, MD Follow up in 2 week(s).   Specialty: General Surgery Why: Please make appointment before 6/23 as patient has some travel plans Contact information: [address] [place] [address] [phone]         Jenell Milliner, MD. Schedule an appointment as soon as possible for a visit.   Specialty: Internal Medicine Contact information: [address] [facility] [place] [address] [phone]                Discharge Exam: Filed Weights   03/04/23 2349 03/06/23 1008  Weight: 64.2 kg 60.3 kg   General.  Well-developed lady, in no acute distress. Pulmonary.  Lungs clear bilaterally, normal respiratory effort. CV.  Regular rate and rhythm, no JVD, rub or murmur. Abdomen.  Soft, nontender, nondistended, BS positive. CNS.  Alert and oriented .  No focal neurologic deficit. Extremities.  No edema, no cyanosis, pulses intact and symmetrical. Psychiatry.  Judgment and insight appears normal.   Condition at discharge: stable  The results of significant diagnostics from this hospitalization (including imaging, microbiology, ancillary and laboratory) are listed below for reference.   Imaging Studies: MR ABDOMEN MRCP W WO CONTAST  Result Date: 03/05/2023 CLINICAL DATA:  Elevated LFTs and gallstone pancreatitis.  Evaluate for common bile duct stone. EXAM: MRI ABDOMEN WITHOUT AND WITH CONTRAST (INCLUDING MRCP) TECHNIQUE: Multiplanar multisequence MR imaging of the abdomen was performed both before and after the administration of intravenous contrast. Heavily T2-weighted images of the biliary and pancreatic ducts were obtained, and three-dimensional MRCP images were rendered by post processing. CONTRAST:  6mL GADAVIST GADOBUTROL 1 MMOL/ML IV SOLN COMPARISON:  U/S abdomen limited 03/05/2023 and CT AP from 03/05/2023 FINDINGS: Lower chest: No acute abnormality. Hepatobiliary: There is diffuse hepatic steatosis. No suspicious enhancing liver lesion. Gallstones. The majority of these are tiny measuring 2-3 mm or less. There is a dominant stone measuring 1.2 cm. No gallbladder wall thickening. Increased caliber of the common bile duct measures 8 mm, image 18/3. Abrupt narrowing of the distal common bile duct is identified just before the ampulla. No convincing evidence for choledocholithiasis. Pancreas: There is diffuse pancreatic edema with peripancreatic inflammatory fat stranding with fluid tracking into bilateral retroperitoneum. No signs of pancreatic necrosis or pseudocyst formation. Spleen:  Within normal limits in size and appearance. Adrenals/Urinary Tract: Normal adrenal glands. Bilateral renal sinus cysts are identified. No follow-up imaging recommended. No suspicious kidney mass or signs of hydronephrosis. Stomach/Bowel: Stomach appears normal. Mild wall thickening involving the duodenum likely reflects secondary inflammation. No pathologic dilatation of the large or small bowel loops. Vascular/Lymphatic: Normal caliber abdominal aorta. The upper abdominal vascularity appears patent. No adenopathy. Other:  None Musculoskeletal: No suspicious bone lesions identified. IMPRESSION: 1. Imaging findings compatible with acute pancreatitis. No signs of  pancreatic necrosis or pseudocyst formation. 2. Gallstones. 3. Increased  caliber of the common bile duct measures 8 mm. Abrupt narrowing of the distal common bile duct is identified just before the ampulla. No convincing evidence for choledocholithiasis. 4. Hepatic steatosis. Electronically Signed   By: Signa Kell M.D.   On: 03/05/2023 05:10   MR 3D Recon At Scanner  Result Date: 03/05/2023 CLINICAL DATA:  Elevated LFTs and gallstone pancreatitis. Evaluate for common bile duct stone. EXAM: MRI ABDOMEN WITHOUT AND WITH CONTRAST (INCLUDING MRCP) TECHNIQUE: Multiplanar multisequence MR imaging of the abdomen was performed both before and after the administration of intravenous contrast. Heavily T2-weighted images of the biliary and pancreatic ducts were obtained, and three-dimensional MRCP images were rendered by post processing. CONTRAST:  6mL GADAVIST GADOBUTROL 1 MMOL/ML IV SOLN COMPARISON:  U/S abdomen limited 03/05/2023 and CT AP from 03/05/2023 FINDINGS: Lower chest: No acute abnormality. Hepatobiliary: There is diffuse hepatic steatosis. No suspicious enhancing liver lesion. Gallstones. The majority of these are tiny measuring 2-3 mm or less. There is a dominant stone measuring 1.2 cm. No gallbladder wall thickening. Increased caliber of the common bile duct measures 8 mm, image 18/3. Abrupt narrowing of the distal common bile duct is identified just before the ampulla. No convincing evidence for choledocholithiasis. Pancreas: There is diffuse pancreatic edema with peripancreatic inflammatory fat stranding with fluid tracking into bilateral retroperitoneum. No signs of pancreatic necrosis or pseudocyst formation. Spleen:  Within normal limits in size and appearance. Adrenals/Urinary Tract: Normal adrenal glands. Bilateral renal sinus cysts are identified. No follow-up imaging recommended. No suspicious kidney mass or signs of hydronephrosis. Stomach/Bowel: Stomach appears normal. Mild wall thickening involving the duodenum likely reflects secondary inflammation. No pathologic  dilatation of the large or small bowel loops. Vascular/Lymphatic: Normal caliber abdominal aorta. The upper abdominal vascularity appears patent. No adenopathy. Other:  None Musculoskeletal: No suspicious bone lesions identified. IMPRESSION: 1. Imaging findings compatible with acute pancreatitis. No signs of pancreatic necrosis or pseudocyst formation. 2. Gallstones. 3. Increased caliber of the common bile duct measures 8 mm. Abrupt narrowing of the distal common bile duct is identified just before the ampulla. No convincing evidence for choledocholithiasis. 4. Hepatic steatosis. Electronically Signed   By: Signa Kell M.D.   On: 03/05/2023 05:10   US Abdomen Limited RUQ (LIVER/GB)  Result Date: 03/05/2023 CLINICAL DATA:  Right upper quadrant abdominal pain EXAM: ULTRASOUND ABDOMEN LIMITED RIGHT UPPER QUADRANT COMPARISON:  None Available. FINDINGS: Gallbladder: Multiple layering gallstones are seen within the gallbladder. The gallbladder, however, is not distended, there is no gallbladder wall thickening, and no pericholecystic fluid is identified. The sonographic Eulah Pont sign is reportedly negative. Common bile duct: Diameter: 6 mm in mid diameter Liver: No focal lesion identified. Within normal limits in parenchymal echogenicity. Portal vein is patent on color Doppler imaging with normal direction of blood flow towards the liver. Other: None. IMPRESSION: 1. Cholelithiasis without sonographic evidence of acute cholecystitis. Electronically Signed   By: Helyn Numbers M.D.   On: 03/05/2023 03:35   CT ABDOMEN PELVIS W CONTRAST  Result Date: 03/05/2023 CLINICAL DATA:  Abdominal pain, acute, nonlocalized EXAM: CT ABDOMEN AND PELVIS WITH CONTRAST TECHNIQUE: Multidetector CT imaging of the abdomen and pelvis was performed using the standard protocol following bolus administration of intravenous contrast. RADIATION DOSE REDUCTION: This exam was performed according to the departmental dose-optimization program  which includes automated exposure control, adjustment of the mA and/or kV according to patient size and/or use of iterative reconstruction technique. CONTRAST:   OMNIPAQUE IOHEXOL 300 MG/ML  SOLN COMPARISON:  None Available. FINDINGS: Lower chest: No acute abnormality. Hepatobiliary: Mild hepatic steatosis. No enhancing intrahepatic mass. No intra or extrahepatic biliary ductal dilation. Cholelithiasis without pericholecystic inflammatory change. Pancreas: There is moderate peripancreatic inflammatory stranding with edema tracking into the greater omentum and anterior pararenal spaces bilaterally in keeping with changes of acute interstitial/edematous pancreatitis. Inflammatory changes surround the entire pancreas. The pancreatic duct is not dilated. Normal enhancement of the pancreatic parenchyma; no evidence of parenchymal necrosis. No loculated peripancreatic fluid collections identified. Spleen: Normal in size without focal abnormality. Adrenals/Urinary Tract: The adrenal glands are unremarkable. 3 mm nonobstructing calculus noted within the upper pole of the right kidney. The kidneys are otherwise unremarkable. Bladder unremarkable. Stomach/Bowel: Mild ascites. Stomach, small bowel, and large bowel are unremarkable. Appendix normal. No free intraperitoneal gas. Vascular/Lymphatic: No significant vascular findings are present. No enlarged abdominal or pelvic lymph nodes. Reproductive: Uterus and bilateral adnexa are unremarkable. Other: No abdominal wall hernia Musculoskeletal: Bilateral L5 pars defects are present without associated spondylolisthesis. Degenerative changes are seen at the lumbosacral junction. No acute bone abnormality. IMPRESSION: 1. Acute interstitial/edematous pancreatitis. No evidence of pancreatic necrosis or loculated peripancreatic fluid collections. 2. Mild hepatic steatosis. 3. Cholelithiasis. 4. Minimal right nonobstructing nephrolithiasis. 5. 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 Electronically Signed   By: Helyn Numbers M.D.   On: 03/05/2023 03:20    Microbiology: No results found for this or any previous visit.  Labs: CBC: Recent Labs  Lab 03/04/23 2352 03/05/23 0644 03/06/23 0458 03/07/23 0456  WBC 14.0* 16.5* 17.6* 14.4*  HGB 14.7 14.3 12.2 11.1*  HCT 45.0 43.7 37.6 34.5*  MCV 92.0 90.7 91.9 92.2  PLT 330 271 249 214   Basic Metabolic Panel: Recent Labs  Lab 03/04/23 2352 03/05/23 0644 03/06/23 0458 03/07/23 0456  NA 138  --  135 136  K 4.3  --  4.0 3.8  CL 102  --  103 104  CO2 23  --  26 26  GLUCOSE 167*  --  116* 101*  BUN 26*  --  17 17  CREATININE 0.94 0.89 0.75 0.77  CALCIUM 9.2  --  8.1* 7.7*   Liver Function Tests: Recent Labs  Lab 03/04/23 2352 03/06/23 0458 03/07/23 0456  AST 163* 55* 56*  ALT 118* 63* 54*  ALKPHOS 87 57 62  BILITOT 1.1 1.1 0.6  PROT 7.6 5.6* 5.3*  ALBUMIN 4.6 3.0* 2.6*   CBG: No results for input(s): "GLUCAP" in the last 168 hours.  Discharge time spent: greater than 30 minutes.  This record has been created using Conservation officer, historic buildings. Errors have been sought and corrected,but may not always be located. Such creation errors do not reflect on the standard of care.   Signed: Arnetha Courser, MD [facility] 03/07/2023

## 2023-03-07 NOTE — Progress Notes (Signed)
Patient ID: Linda Ritter, female   DOB: 1956-03-16, 67 y.o.   MRN: [id number]     SURGICAL PROGRESS NOTE   Hospital Day(s): 2.   Interval History: Patient seen and examined, no acute events or new complaints overnight. Patient reports feeling better this morning.  She understood that the pain is slowly improving.  She denies any nausea or vomiting.  No issues with the wound.  Tolerating diet.  Vital signs in last 24 hours: [min-max] current  Temp:  [97.3 F (36.3 C)-98.8 F (37.1 C)] 98.8 F (37.1 C) (06/11 0427) Pulse Rate:  [68-74] 70 (06/11 0427) Resp:  [11-17] 17 (06/11 0427) BP: (81-129)/(50-78) 129/78 (06/11 0427) SpO2:  [88 %-98 %] 94 % (06/11 0427) Weight:  [60.3 kg] 60.3 kg (06/10 1008)     Height: 5\' 1"  (154.9 cm) Weight: 60.3 kg BMI (Calculated): 25.14   Physical Exam:  Constitutional: alert, cooperative and no distress  Respiratory: breathing non-labored at rest  Cardiovascular: regular rate and sinus rhythm  Gastrointestinal: soft, non-tender, and non-distended  Labs:     Latest Ref Rng & Units 03/07/2023    4:56 AM 03/06/2023    4:58 AM 03/05/2023    6:44 AM  CBC  WBC 4.0 - 10.5 K/uL 14.4  17.6  16.5   Hemoglobin 12.0 - 15.0 g/dL 78.2  95.6  21.3   Hematocrit 36.0 - 46.0 % 34.5  37.6  43.7   Platelets 150 - 400 K/uL 214  249  271       Latest Ref Rng & Units 03/07/2023    4:56 AM 03/06/2023    4:58 AM 03/05/2023    6:44 AM  CMP  Glucose 70 - 99 mg/dL 086  578    BUN 8 - 23 mg/dL 17  17    Creatinine 4.69 - 1.00 mg/dL 6.29  5.28  4.13   Sodium 135 - 145 mmol/L 136  135    Potassium 3.5 - 5.1 mmol/L 3.8  4.0    Chloride 98 - 111 mmol/L 104  103    CO2 22 - 32 mmol/L 26  26    Calcium 8.9 - 10.3 mg/dL 7.7  8.1    Total Protein 6.5 - 8.1 g/dL 5.3  5.6    Total Bilirubin 0.3 - 1.2 mg/dL 0.6  1.1    Alkaline Phos 38 - 126 U/L 62  57    AST 15 - 41 U/L 56  55    ALT 0 - 44 U/L 54  63     Imaging studies: No new pertinent imaging studies   Assessment/Plan:   67 y.o. female with biliary pancreatitis 1 Day Post-Op s/p robotic assisted upper scopic cholecystectomy.  -Patient tolerated the procedure well.  She is recovering adequately from surgery.  Stable vital signs. -Labs continue adequate decreasing trend -Pain control -Tolerating diet -Patient can be discharged from the surgical standpoint.  Pending pathology.  I will call patient with pathology results. -I will follow her in the office in 2 weeks -Pain medication prescription sent to pharmacy -Discharge instructions in chart.  Gae Gallop, MD

## 2023-03-27 ENCOUNTER — Ambulatory Visit
Admission: RE | Admit: 2023-03-27 | Discharge: 2023-03-27 | Disposition: A | Discharge status: Home / Self Care | Payer: Medicare PPO | Source: Ambulatory Visit | Attending: Family Medicine | Admitting: Family Medicine

## 2023-03-27 DIAGNOSIS — Z1231 Encounter for screening mammogram for malignant neoplasm of breast: Secondary | ICD-10-CM | POA: Insufficient documentation

## 2023-04-18 IMAGING — MG MM DIGITAL SCREENING BILAT W/ TOMO AND CAD
8 series · 8 of 24 positions shown · non-contrast
Comparison: Previous exam(s).

CLINICAL DATA: Screening.

EXAM:
DIGITAL SCREENING BILATERAL MAMMOGRAM WITH TOMOSYNTHESIS AND CAD
TECHNIQUE: Bilateral screening digital craniocaudal and mediolateral oblique
mammograms were obtained. Bilateral screening digital breast
tomosynthesis was performed. The images were evaluated with
computer-aided detection.

[R MLO synth-2D]
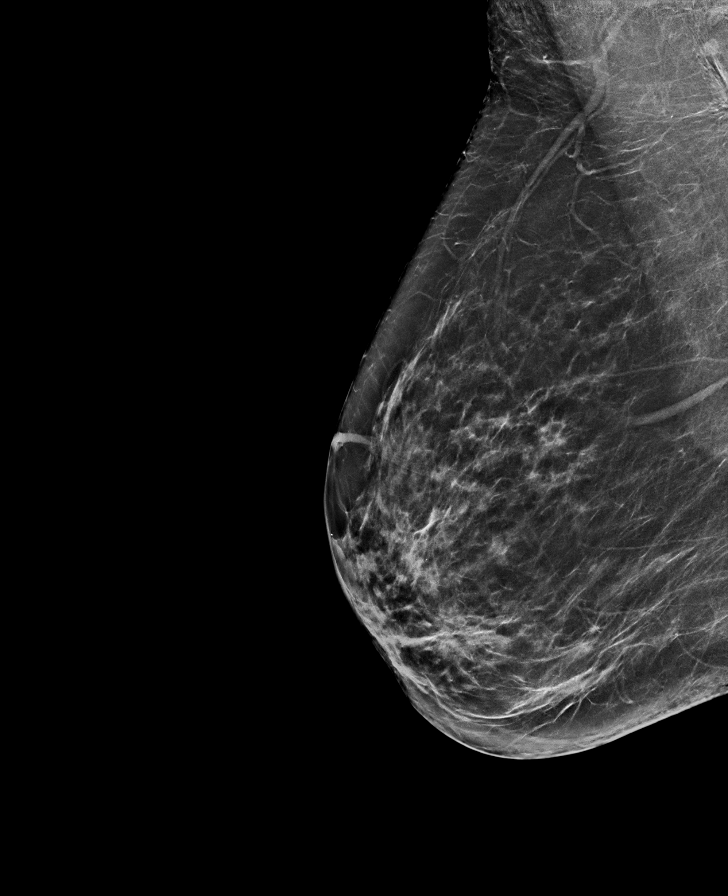

[R CC synth-2D]
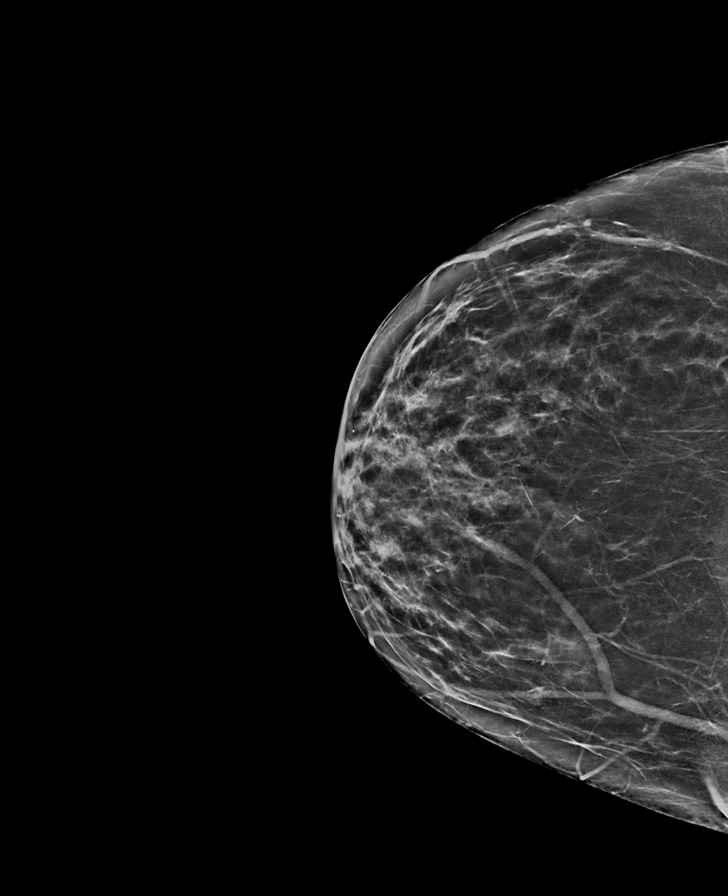

[L CC synth-2D]
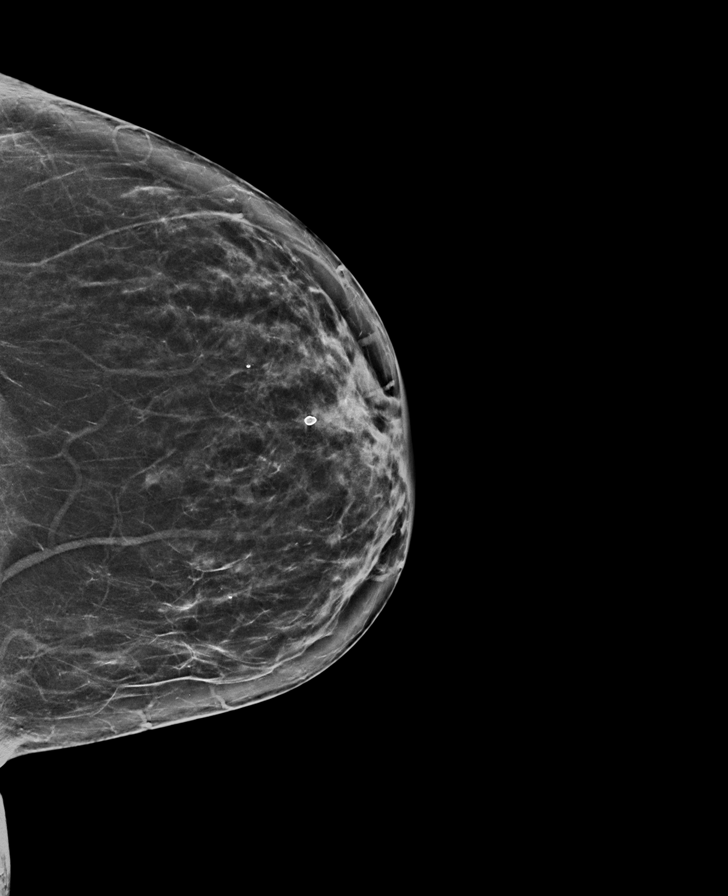

[L MLO synth-2D]
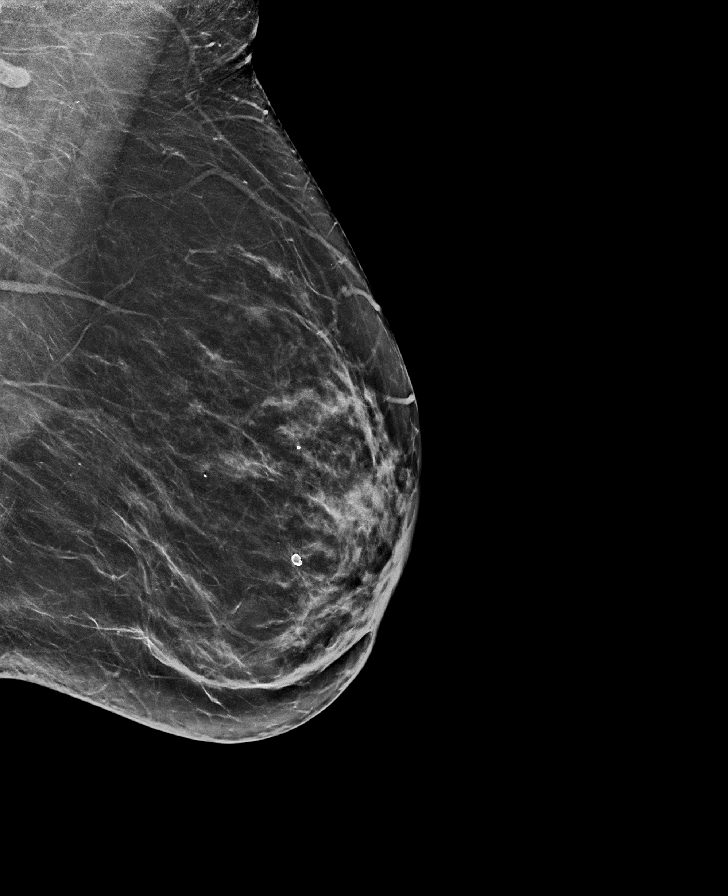

[L MLO tomo · tomo slice 37/74.0]
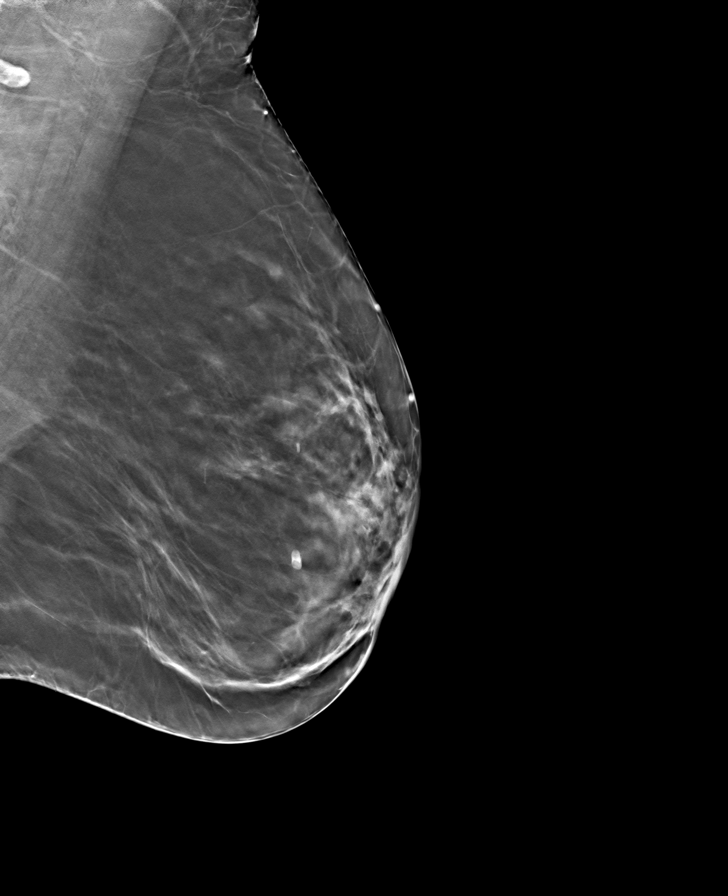

[R CC tomo · tomo slice 33/64.0]
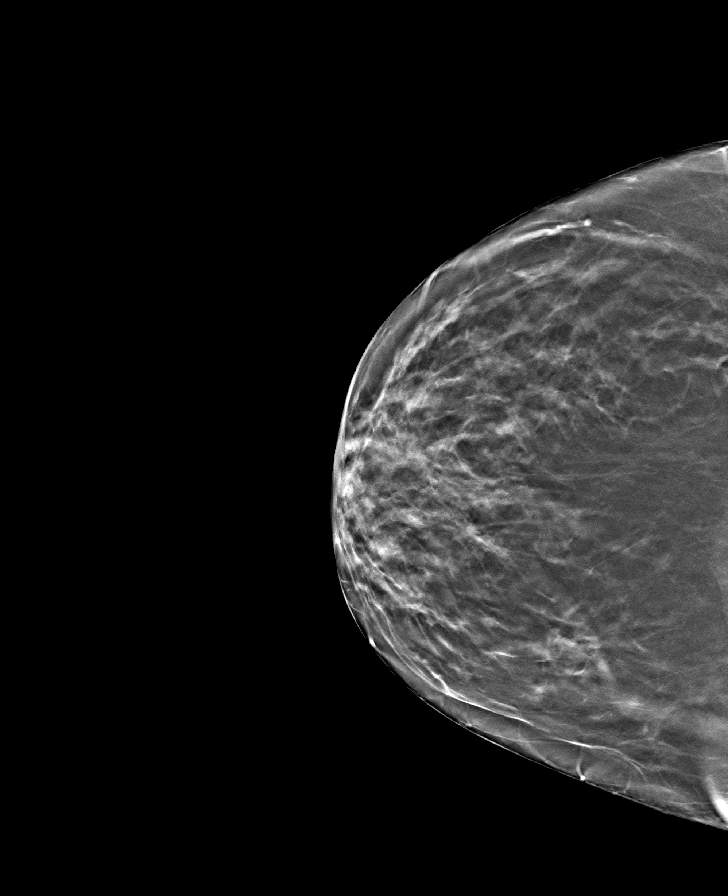

[R MLO tomo · tomo slice 36/71.0]
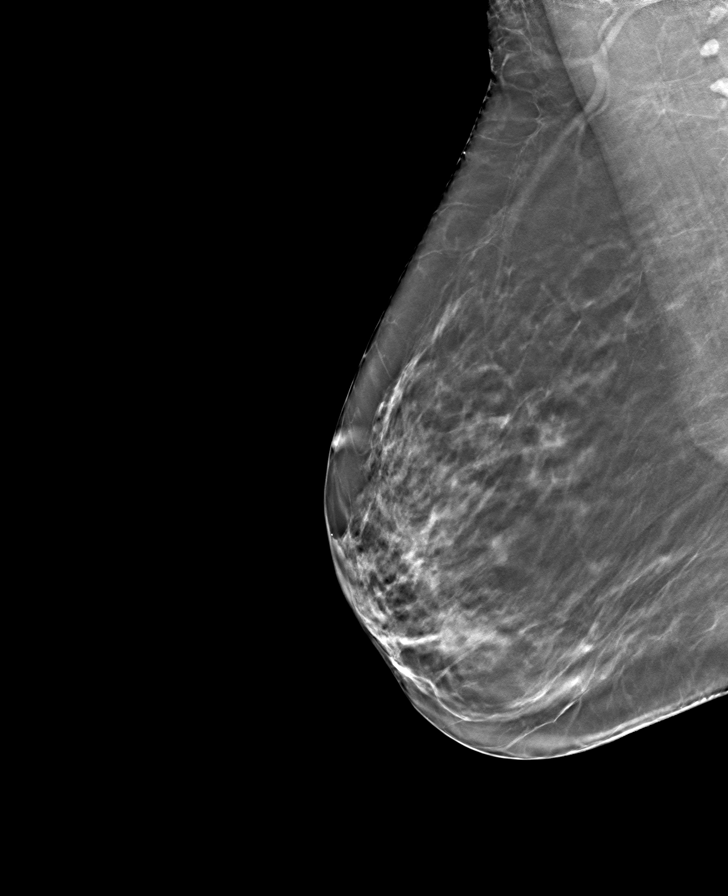

[L CC tomo · tomo slice 36/71.0]
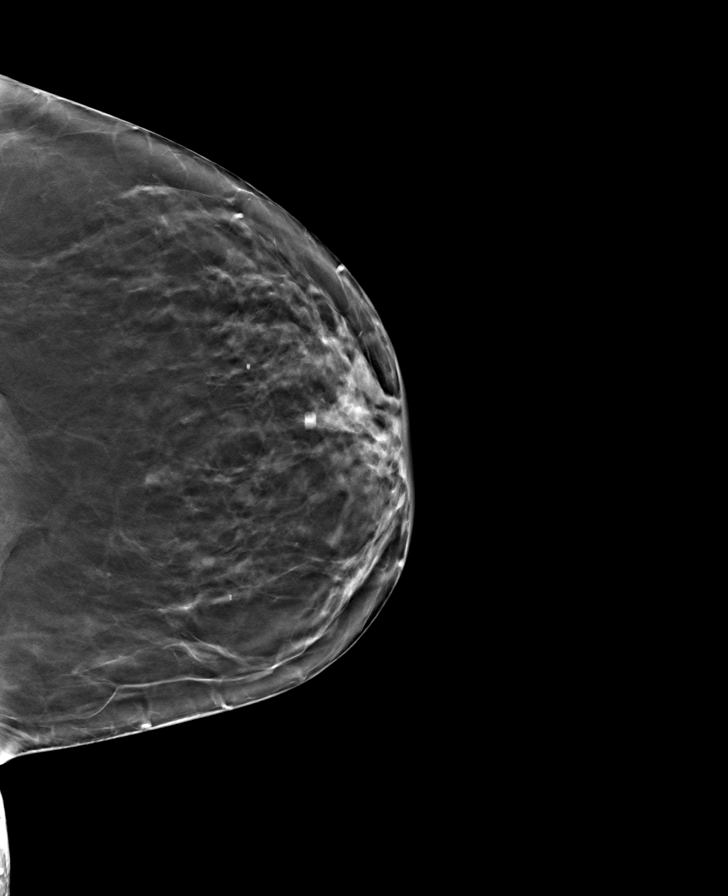

[8 of 24 positions shown; findings below may reference images not displayed]

ACR Breast Density Category b: There are scattered areas of
fibroglandular density.
FINDINGS: There are no findings suspicious for malignancy.
IMPRESSION: No mammographic evidence of malignancy. A result letter of this
screening mammogram will be mailed directly to the patient.

RECOMMENDATION:
Screening mammogram in one year. (Code:51-O-LD2)

BI-RADS CATEGORY  1: Negative.

## 2023-11-05 NOTE — Progress Notes (Signed)
 Patient had virtual visit earlier today- COVID positive. Patient has normal renal function, no medications to interfere with Paxlovid which was previously tolerated. Paxlovid prescription sent in. Zofran  also sent for n/v.

## 2023-11-05 NOTE — Progress Notes (Signed)
 [facility] ENT Encounter This medical encounter was conducted virtually using [email or web address]  [email or web address] protocols.  Patient ID: Linda Ritter is a 68 y.o. female who presents by video interaction for evaluation.  I have identified myself to the patient and conveyed my credentials to Almarie Collum.  Patient has signed informed consent on file in medical record.  Present on Video Call: Is there someone else in the room? No..  Assessment/Plan:   There are no diagnoses linked to this encounter.   -- Discussed the new prescription noted above, including potential side effects, drug interactions, instructions for taking the medication, and the consequences of not taking it. -- Patient verbalized an understanding of today's assessment and recommendations, as well as the purpose of ongoing medications.  Follow-up as Needed   Pt to message with viral testing results.  Sx treatment if negative.  Appropriate antiviral if +.  Medication adherence and barriers to the treatment plan have been addressed. Opportunities to optimize healthy behaviors have been discussed. Patient / caregiver voiced understanding.     Subjective:   HPI Linda Ritter is 68 y.o. and presents today in the [facility] with ENT symptoms.  The PCP for this patient is Mangel, Benison Pap, DO.     Pt with 2 days of marked sinus congesiton nad coughing.  NO sob or cp or fever.  No known specific sick contacts.      ROS Review of Systems   All other ROS per HPI.  I have reviewed the problem list, past medical history, past family history, medications, and allergies and have updated/reconciled them if needed.      Objective:  Physical Exam As part of this Video Visit, no in-person exam was conducted. Video interaction permitted the following observations.  General: No acute distress.  HEENT:  No visible mass or abnormality of neck.  RESP: Relaxed respiratory effort. No conversational dyspnea.   SKIN: No rashes noted. NEURO: Normal coordination.  No tremors observed. PSYCH: Alert and oriented.  Speech fluent and sensible.  Calm affect.      The patient reports they are physically located in [place]  and is currently: at home. I conducted a audio/video visit. I spent  71m 55s on the video call with the patient. I spent an additional 3 minutes on pre- and post-visit activities on the date of service .

## 2023-12-20 ENCOUNTER — Other Ambulatory Visit: Payer: Self-pay | Admitting: Family Medicine

## 2023-12-20 DIAGNOSIS — Z1231 Encounter for screening mammogram for malignant neoplasm of breast: Secondary | ICD-10-CM

## 2024-03-27 ENCOUNTER — Ambulatory Visit

## 2024-04-01 ENCOUNTER — Ambulatory Visit
Admission: RE | Admit: 2024-04-01 | Discharge: 2024-04-01 | Disposition: A | Discharge status: Home / Self Care | Source: Ambulatory Visit | Attending: Family Medicine | Admitting: Family Medicine

## 2024-04-01 DIAGNOSIS — Z1231 Encounter for screening mammogram for malignant neoplasm of breast: Secondary | ICD-10-CM | POA: Insufficient documentation

## 2024-05-24 ENCOUNTER — Other Ambulatory Visit: Payer: Self-pay | Admitting: Family Medicine

## 2024-05-24 DIAGNOSIS — M8580 Other specified disorders of bone density and structure, unspecified site: Secondary | ICD-10-CM

## 2024-05-24 DIAGNOSIS — K760 Fatty (change of) liver, not elsewhere classified: Secondary | ICD-10-CM

## 2024-05-31 ENCOUNTER — Ambulatory Visit
Admission: RE | Admit: 2024-05-31 | Discharge: 2024-05-31 | Disposition: A | Discharge status: Home / Self Care | Source: Ambulatory Visit | Attending: Family Medicine | Admitting: Family Medicine

## 2024-05-31 DIAGNOSIS — K760 Fatty (change of) liver, not elsewhere classified: Secondary | ICD-10-CM | POA: Diagnosis present

## 2024-06-26 ENCOUNTER — Ambulatory Visit
Admission: RE | Admit: 2024-06-26 | Discharge: 2024-06-26 | Disposition: A | Discharge status: Home / Self Care | Source: Ambulatory Visit | Attending: Family Medicine | Admitting: Family Medicine

## 2024-06-26 DIAGNOSIS — M810 Age-related osteoporosis without current pathological fracture: Secondary | ICD-10-CM | POA: Diagnosis not present

## 2024-06-26 DIAGNOSIS — Z13820 Encounter for screening for osteoporosis: Secondary | ICD-10-CM | POA: Insufficient documentation

## 2024-06-26 DIAGNOSIS — M8580 Other specified disorders of bone density and structure, unspecified site: Secondary | ICD-10-CM

## 2024-06-26 DIAGNOSIS — Z780 Asymptomatic menopausal state: Secondary | ICD-10-CM | POA: Diagnosis not present

## 2024-07-02 ENCOUNTER — Other Ambulatory Visit: Payer: Self-pay

## 2024-07-02 ENCOUNTER — Encounter: Payer: Self-pay | Admitting: Ophthalmology

## 2024-07-04 NOTE — Discharge Instructions (Signed)

## 2024-07-08 ENCOUNTER — Other Ambulatory Visit: Payer: Self-pay

## 2024-07-08 ENCOUNTER — Ambulatory Visit: Payer: Self-pay | Admitting: Anesthesiology

## 2024-07-08 ENCOUNTER — Encounter: Payer: Self-pay | Admitting: Ophthalmology

## 2024-07-08 ENCOUNTER — Encounter
Admission: RE | Disposition: A | Discharge status: Home / Self Care | Payer: Self-pay | Source: Home / Self Care | Attending: Ophthalmology

## 2024-07-08 ENCOUNTER — Ambulatory Visit
Admission: RE | Admit: 2024-07-08 | Discharge: 2024-07-08 | Disposition: A | Discharge status: Home / Self Care | Attending: Ophthalmology | Admitting: Ophthalmology

## 2024-07-08 DIAGNOSIS — H2511 Age-related nuclear cataract, right eye: Secondary | ICD-10-CM | POA: Diagnosis present

## 2024-07-08 HISTORY — DX: Gastro-esophageal reflux disease without esophagitis: K21.9

## 2024-07-08 HISTORY — DX: Anxiety disorder, unspecified: F41.9

## 2024-07-08 HISTORY — PX: CATARACT EXTRACTION W/PHACO: SHX586

## 2024-07-08 HISTORY — DX: Other specified abnormal findings of blood chemistry: R79.89

## 2024-07-08 HISTORY — DX: Fatty (change of) liver, not elsewhere classified: K76.0

## 2024-07-08 HISTORY — DX: Depression, unspecified: F32.A

## 2024-07-08 HISTORY — DX: Age-related osteoporosis without current pathological fracture: M81.0

## 2024-07-08 HISTORY — DX: Biliary acute pancreatitis without necrosis or infection: K85.10

## 2024-07-08 SURGERY — PHACOEMULSIFICATION, CATARACT, WITH IOL INSERTION
Anesthesia: Monitor Anesthesia Care | Site: Eye | Laterality: Right

## 2024-07-08 MED ORDER — MOXIFLOXACIN HCL 0.5 % OP SOLN
OPHTHALMIC | Status: DC | PRN
  Administered 2024-07-08: .2 mL via OPHTHALMIC

## 2024-07-08 MED ORDER — SIGHTPATH DOSE#1 NA HYALUR & NA CHOND-NA HYALUR IO KIT
PACK | INTRAOCULAR | Status: DC | PRN
  Administered 2024-07-08: 1 via OPHTHALMIC

## 2024-07-08 MED ORDER — FENTANYL CITRATE (PF) 100 MCG/2ML IJ SOLN
INTRAMUSCULAR | Status: DC | PRN
  Administered 2024-07-08: 100 ug via INTRAVENOUS

## 2024-07-08 MED ORDER — ARMC OPHTHALMIC DILATING DROPS
OPHTHALMIC | Status: AC
Start: 2024-07-08 — End: 2024-07-08
  Filled 2024-07-08: qty 0.5

## 2024-07-08 MED ORDER — LIDOCAINE HCL (PF) 2 % IJ SOLN
INTRAOCULAR | Status: DC | PRN
  Administered 2024-07-08: 4 mL via INTRAOCULAR

## 2024-07-08 MED ORDER — SIGHTPATH DOSE#1 BSS IO SOLN
INTRAOCULAR | Status: DC | PRN
  Administered 2024-07-08: 15 mL via INTRAOCULAR

## 2024-07-08 MED ORDER — SIGHTPATH DOSE#1 BSS IO SOLN
INTRAOCULAR | Status: DC | PRN
  Administered 2024-07-08: 61 mL via OPHTHALMIC

## 2024-07-08 MED ORDER — MIDAZOLAM HCL 5 MG/5ML IJ SOLN
INTRAMUSCULAR | Status: DC | PRN
  Administered 2024-07-08: 2 mg via INTRAVENOUS

## 2024-07-08 MED ORDER — TETRACAINE HCL 0.5 % OP SOLN
1.0000 [drp] | OPHTHALMIC | Status: DC | PRN
  Administered 2024-07-08 (×3): 1 [drp] via OPHTHALMIC

## 2024-07-08 MED ORDER — LACTATED RINGERS IV SOLN: INTRAVENOUS | Status: DC

## 2024-07-08 MED ORDER — TETRACAINE HCL 0.5 % OP SOLN
OPHTHALMIC | Status: AC
  Filled 2024-07-08: qty 4

## 2024-07-08 MED ORDER — ARMC OPHTHALMIC DILATING DROPS
1.0000 | OPHTHALMIC | Status: DC | PRN
  Administered 2024-07-08 (×3): 1 via OPHTHALMIC

## 2024-07-08 MED ORDER — MIDAZOLAM HCL 2 MG/2ML IJ SOLN
INTRAMUSCULAR | Status: AC
  Filled 2024-07-08: qty 2

## 2024-07-08 MED ORDER — FENTANYL CITRATE (PF) 100 MCG/2ML IJ SOLN
INTRAMUSCULAR | Status: AC
  Filled 2024-07-08: qty 2

## 2024-07-08 SURGICAL SUPPLY — 9 items
DISSECTOR HYDRO NUCLEUS 50X22 (MISCELLANEOUS) ×1 IMPLANT
FEE CATARACT SUITE SIGHTPATH (MISCELLANEOUS) ×1 IMPLANT
GLOVE PI ULTRA LF STRL 7.5 (GLOVE) ×1 IMPLANT
GLOVE SURG SYN 6.5 PF PI BL (GLOVE) ×1 IMPLANT
GLOVE SURG SYN 8.5 PF PI BL (GLOVE) ×1 IMPLANT
LENS IOL TECNIS EYHANCE 20.5 (Intraocular Lens) IMPLANT
NDL FILTER BLUNT 18X1 1/2 (NEEDLE) ×1 IMPLANT
NEEDLE FILTER BLUNT 18X1 1/2 (NEEDLE) ×1 IMPLANT
SYR 3ML LL SCALE MARK (SYRINGE) ×1 IMPLANT

## 2024-07-08 NOTE — Anesthesia Postprocedure Evaluation (Signed)
 Anesthesia Post Note  Patient: Rendi Mapel  Procedure(s) Performed: PHACOEMULSIFICATION, CATARACT, WITH IOL INSERTION 3.19 00:29.8 (Right: Eye)  Patient location during evaluation: PACU Anesthesia Type: MAC Level of consciousness: awake and alert Pain management: pain level controlled Vital Signs Assessment: post-procedure vital signs reviewed and stable Respiratory status: spontaneous breathing, nonlabored ventilation, respiratory function stable and patient connected to nasal cannula oxygen Cardiovascular status: stable and blood pressure returned to baseline Postop Assessment: no apparent nausea or vomiting Anesthetic complications: no   No notable events documented.   Last Vitals:  Vitals:   07/08/24 1127 07/08/24 1130  BP: 121/67 129/81  Pulse: 62 74  Resp: 12 12  Temp: (!) 36.2 C (!) 36.2 C  SpO2: 100% 99%    Last Pain:  Vitals:   07/08/24 1130  TempSrc:   PainSc: 0-No pain                 Hazaiah Edgecombe C Lemmie Vanlanen

## 2024-07-08 NOTE — Transfer of Care (Signed)
 Immediate Anesthesia Transfer of Care Note  Patient: Linda Ritter  Procedure(s) Performed: PHACOEMULSIFICATION, CATARACT, WITH IOL INSERTION 3.19 00:29.8 (Right: Eye)  Patient Location: PACU  Anesthesia Type: MAC  Level of Consciousness: awake, alert  and patient cooperative  Airway and Oxygen Therapy: Patient Spontanous Breathing and Patient connected to supplemental oxygen  Post-op Assessment: Post-op Vital signs reviewed, Patient's Cardiovascular Status Stable, Respiratory Function Stable, Patent Airway and No signs of Nausea or vomiting  Post-op Vital Signs: Reviewed and stable  Complications: No notable events documented.

## 2024-07-08 NOTE — H&P (Signed)
 [facility]   Primary Care Physician:  Keven Crumbly Pap, MD Ophthalmologist: Dr. Adine Novak  Pre-Procedure History & Physical: HPI:  Linda Ritter is a 68 y.o. female here for cataract surgery.   History reviewed. No pertinent past medical history.  Past Surgical History:  Procedure Laterality Date   FRACTURE SURGERY Right    wrist   OOPHORECTOMY Right     Prior to Admission medications   Medication Sig Start Date End Date Taking? Authorizing Provider  calcium-vitamin D (OSCAL WITH D) 500-5 MG-MCG tablet Take 1 tablet by mouth.   Yes [provider]  Multiple Vitamin (MULTIVITAMIN) tablet Take 1 tablet by mouth daily.   Yes [provider]  ondansetron  (ZOFRAN ) 4 MG tablet Take 1 tablet (4 mg total) by mouth every 6 (six) hours as needed for nausea. 03/07/23  Yes Amin, Sumayya, MD  sertraline (ZOLOFT) 25 MG tablet Take 25 mg by mouth daily. Patient not taking: Reported on 07/02/2024 03/11/21   [provider]    Allergies as of 06/27/2024   (No Known Allergies)    Family History  Problem Relation Age of Onset   Breast cancer Neg Hx     Social History   Socioeconomic History   Marital status: Married    Spouse name: Not on file   Number of children: Not on file   Years of education: Not on file   Highest education level: Not on file  Occupational History   Not on file  Tobacco Use   Smoking status: Never   Smokeless tobacco: Never  Vaping Use   Vaping status: Never Used  Substance and Sexual Activity   Alcohol use: Never   Drug use: Never   Sexual activity: Not on file  Other Topics Concern   Not on file  Social History Narrative   Not on file   Social Drivers of Health   Financial Resource Strain: Low Risk  (05/16/2024)   Received from [facility]   Overall Financial Resource Strain (CARDIA)    How hard is it for you to pay for the very basics like food, housing, medical care, and heating?: Not very hard  Food  Insecurity: No Food Insecurity (05/23/2024)   Received from [facility]   Hunger Vital Sign    Within the past 12 months, you worried that your food would run out before you got the money to buy more.: Never true    Within the past 12 months, the food you bought just didn't last and you didn't have money to get more.: Never true  Transportation Needs: No Transportation Needs (05/23/2024)   Received from [facility]   PRAPARE - Transportation    Lack of Transportation (Medical): No    Lack of Transportation (Non-Medical): No  Physical Activity: Not on file  Stress: No Stress Concern Present (05/23/2023)   Received from [facility] - Occupational Stress Questionnaire    Feeling of Stress : Not at all  Social Connections: Not on file  Intimate Partner Violence: Not At Risk (03/05/2023)   Humiliation, Afraid, Rape, and Kick questionnaire    Fear of Current or Ex-Partner: No    Emotionally Abused: No    Physically Abused: No    Sexually Abused: No    Review of Systems: See HPI, otherwise negative ROS  Physical Exam: BP (!) 147/83   Temp 97.6 F (36.4 C) (Temporal)   Resp 12  Ht 5' 0.98 (1.549 m)   Wt 58.5 kg   SpO2 100%   BMI 24.39 kg/m  General:   Alert, cooperative. Head:  Normocephalic and atraumatic. Respiratory:  Normal work of breathing. Cardiovascular:  NAD  Impression/Plan: Linda Ritter is here for cataract surgery.  Risks, benefits, limitations, and alternatives regarding cataract surgery have been reviewed with the patient.  Questions have been answered.  All parties agreeable.   Adine Novak, MD  07/08/2024, 10:58 AM

## 2024-07-08 NOTE — Anesthesia Preprocedure Evaluation (Addendum)
 Anesthesia Evaluation  Patient identified by MRN, date of birth, ID band Patient awake    Reviewed: Allergy & Precautions, H&P , NPO status , Patient's Chart, lab work & pertinent test results  Airway Mallampati: III  TM Distance: >3 FB     Dental   Pulmonary neg pulmonary ROS          Cardiovascular negative cardio ROS      Neuro/Psych negative neurological ROS  negative psych ROS   GI/Hepatic negative GI ROS, Neg liver ROS,,,  Endo/Other  negative endocrine ROS    Renal/GU negative Renal ROS  negative genitourinary   Musculoskeletal negative musculoskeletal ROS (+)    Abdominal   Peds negative pediatric ROS (+)  Hematology negative hematology ROS (+)   Anesthesia Other Findings   Reproductive/Obstetrics negative OB ROS                              Anesthesia Physical Anesthesia Plan  ASA: 1  Anesthesia Plan: MAC   Post-op Pain Management:    Induction: Intravenous  PONV Risk Score and Plan:   Airway Management Planned: Natural Airway and Nasal Cannula  Additional Equipment:   Intra-op Plan:   Post-operative Plan:   Informed Consent: I have reviewed the patients History and Physical, chart, labs and discussed the procedure including the risks, benefits and alternatives for the proposed anesthesia with the patient or authorized representative who has indicated his/her understanding and acceptance.     Dental Advisory Given  Plan Discussed with: Anesthesiologist, CRNA and Surgeon  Anesthesia Plan Comments: (Patient consented for risks of anesthesia including but not limited to:  - adverse reactions to medications - damage to eyes, teeth, lips or other oral mucosa - nerve damage due to positioning  - sore throat or hoarseness - Damage to heart, brain, nerves, lungs, other parts of body or loss of life  Patient voiced understanding and assent.)          Anesthesia Quick Evaluation

## 2024-07-08 NOTE — Op Note (Signed)
 OPERATIVE NOTE  Bonnita Newby [id number] 07/08/2024   PREOPERATIVE DIAGNOSIS:  Nuclear sclerotic cataract right eye.  H25.11   POSTOPERATIVE DIAGNOSIS:    Nuclear sclerotic cataract right eye.     PROCEDURE:  Phacoemusification with posterior chamber intraocular lens placement of the right eye   LENS:   Implant Name Type Inv. Item Serial No. Manufacturer Lot No. LRB No. Used Action  LENS IOL TECNIS EYHANCE 20.5 - [id number] Intraocular Lens LENS IOL TECNIS EYHANCE 20.5 [id number] [employer]  Right 1 Implanted       Procedure(s): PHACOEMULSIFICATION, CATARACT, WITH IOL INSERTION 3.19 00:29.8 (Right)  SURGEON:  Adine Novak, MD, MPH  ANESTHESIOLOGIST: Anesthesiologist: Ola Donny BROCKS, MD CRNA: Niki Manus SAUNDERS, CRNA   ANESTHESIA:  Topical with tetracaine drops augmented with 1% preservative-free intracameral lidocaine .  ESTIMATED BLOOD LOSS: less than 1 mL.   COMPLICATIONS:  None.   DESCRIPTION OF PROCEDURE:  The patient was identified in the holding room and transported to the operating room and placed in the supine position under the operating microscope.  The right eye was identified as the operative eye and it was prepped and draped in the usual sterile ophthalmic fashion.   A 1.0 millimeter clear-corneal paracentesis was made at the 10:30 position. 0.5 ml of preservative-free 1% lidocaine  with epinephrine  was injected into the anterior chamber.  The anterior chamber was filled with viscoelastic.  A 2.4 millimeter keratome was used to make a near-clear corneal incision at the 8:00 position.  A curvilinear capsulorrhexis was made with a cystotome and capsulorrhexis forceps.  Balanced salt solution was used to hydrodissect and hydrodelineate the nucleus.   Phacoemulsification was then used in stop and chop fashion to remove the lens nucleus and epinucleus.  The remaining cortex was then removed using the irrigation and aspiration handpiece. Viscoelastic was then placed into the  capsular bag to distend it for lens placement.  A lens was then injected into the capsular bag.  The remaining viscoelastic was aspirated.   Wounds were hydrated with balanced salt solution.  The anterior chamber was inflated to a physiologic pressure with balanced salt solution.   Intracameral vigamox 0.1 mL undiluted was injected into the eye and a drop placed onto the ocular surface.  No wound leaks were noted.  The wounds were checked with a fluorescein strip since she had RK surgery. Combigan was placed on the eye.    The patient was taken to the recovery room in stable condition without complications of anesthesia or surgery  Adine Novak 07/08/2024, 11:26 AM

## 2024-07-09 ENCOUNTER — Encounter: Payer: Self-pay | Admitting: Ophthalmology

## 2024-07-15 NOTE — Anesthesia Preprocedure Evaluation (Addendum)
 Anesthesia Evaluation  Patient identified by MRN, date of birth, ID band Patient awake    Reviewed: Allergy & Precautions, H&P , NPO status , Patient's Chart, lab work & pertinent test results  Airway Mallampati: III  TM Distance: >3 FB Neck ROM: Full    Dental no notable dental hx.    Pulmonary neg pulmonary ROS   Pulmonary exam normal breath sounds clear to auscultation       Cardiovascular negative cardio ROS Normal cardiovascular exam Rhythm:Regular Rate:Normal     Neuro/Psych  PSYCHIATRIC DISORDERS Anxiety Depression    negative neurological ROS  negative psych ROS   GI/Hepatic negative GI ROS, Neg liver ROS,GERD  ,,  Endo/Other  negative endocrine ROS    Renal/GU negative Renal ROS  negative genitourinary   Musculoskeletal negative musculoskeletal ROS (+)    Abdominal   Peds negative pediatric ROS (+)  Hematology negative hematology ROS (+)   Anesthesia Other Findings Previous cataract surgery 07-08-24 Dr. Ola  Medical History  Hepatic steatosis Anxiety Osteoporosis Elevated LFTs Depression Acute biliary pancreatitis without infection or necrosis GERD (gastroesophageal reflux disease     Reproductive/Obstetrics negative OB ROS                              Anesthesia Physical Anesthesia Plan  ASA: 2  Anesthesia Plan: MAC   Post-op Pain Management:    Induction: Intravenous  PONV Risk Score and Plan:   Airway Management Planned: Natural Airway and Nasal Cannula  Additional Equipment:   Intra-op Plan:   Post-operative Plan:   Informed Consent: I have reviewed the patients History and Physical, chart, labs and discussed the procedure including the risks, benefits and alternatives for the proposed anesthesia with the patient or authorized representative who has indicated his/her understanding and acceptance.     Dental Advisory Given  Plan Discussed with:  Anesthesiologist, CRNA and Surgeon  Anesthesia Plan Comments: (Patient consented for risks of anesthesia including but not limited to:  - adverse reactions to medications - damage to eyes, teeth, lips or other oral mucosa - nerve damage due to positioning  - sore throat or hoarseness - Damage to heart, brain, nerves, lungs, other parts of body or loss of life  Patient voiced understanding and assent.)         Anesthesia Quick Evaluation

## 2024-07-17 NOTE — Discharge Instructions (Signed)

## 2024-07-18 ENCOUNTER — Other Ambulatory Visit: Payer: Self-pay

## 2024-07-18 ENCOUNTER — Emergency Department
Admission: EM | Admit: 2024-07-18 | Discharge: 2024-07-18 | Disposition: A | Discharge status: Home / Self Care | Attending: Emergency Medicine | Admitting: Emergency Medicine

## 2024-07-18 ENCOUNTER — Emergency Department

## 2024-07-18 DIAGNOSIS — M25511 Pain in right shoulder: Secondary | ICD-10-CM

## 2024-07-18 DIAGNOSIS — S42291A Other displaced fracture of upper end of right humerus, initial encounter for closed fracture: Secondary | ICD-10-CM | POA: Insufficient documentation

## 2024-07-18 DIAGNOSIS — Y9301 Activity, walking, marching and hiking: Secondary | ICD-10-CM | POA: Diagnosis not present

## 2024-07-18 DIAGNOSIS — M25521 Pain in right elbow: Secondary | ICD-10-CM | POA: Diagnosis not present

## 2024-07-18 DIAGNOSIS — W19XXXA Unspecified fall, initial encounter: Secondary | ICD-10-CM

## 2024-07-18 DIAGNOSIS — W1839XA Other fall on same level, initial encounter: Secondary | ICD-10-CM | POA: Insufficient documentation

## 2024-07-18 DIAGNOSIS — S4991XA Unspecified injury of right shoulder and upper arm, initial encounter: Secondary | ICD-10-CM | POA: Diagnosis present

## 2024-07-18 MED ORDER — OXYCODONE HCL 5 MG PO TABS
5.0000 mg | ORAL_TABLET | Freq: Once | ORAL | Status: AC
  Administered 2024-07-18: 5 mg via ORAL
  Filled 2024-07-18: qty 1

## 2024-07-18 MED ORDER — ACETAMINOPHEN 500 MG PO TABS
1000.0000 mg | ORAL_TABLET | Freq: Once | ORAL | Status: AC
  Administered 2024-07-18: 1000 mg via ORAL
  Filled 2024-07-18: qty 2

## 2024-07-18 MED ORDER — OXYCODONE HCL 5 MG PO TABS: 5.0000 mg | ORAL_TABLET | Freq: Three times a day (TID) | ORAL | 0 refills | Status: DC | PRN

## 2024-07-18 NOTE — ED Provider Notes (Signed)
 [facility] Provider Note    Event Date/Time   First MD Initiated Contact with Patient 07/18/24 1138     (approximate)   History   Shoulder Pain   HPI  Fred Hammes is a 68 y.o. female with history of anxiety, depression, GERD, osteoporosis, presenting with right shoulder pain.  Patient states after walking her dog, it pulled and she fell onto her right shoulder.  Is not on any blood thinning medication.  Denies head strike.  States that she has some right neck aching in the trapezius region.  States that is mainly her right shoulder that is aching as well as her right distal humerus.  States that she broke her right wrist last year and has a plate in there.  No tenderness there.  She denies any weakness or numbness or pain anywhere else.     Physical Exam   Triage Vital Signs: ED Triage Vitals  Encounter Vitals Group     BP 07/18/24 1118 135/78     Girls Systolic BP Percentile --      Girls Diastolic BP Percentile --      Boys Systolic BP Percentile --      Boys Diastolic BP Percentile --      Pulse Rate 07/18/24 1118 86     Resp 07/18/24 1118 20     Temp 07/18/24 1118 97.7 F (36.5 C)     Temp Source 07/18/24 1118 Oral     SpO2 07/18/24 1118 100 %     Weight 07/18/24 1118 127 lb (57.6 kg)     Height 07/18/24 1118 5' 1 (1.549 m)     Head Circumference --      Peak Flow --      Pain Score 07/18/24 1121 9     Pain Loc --      Pain Education --      Exclude from Growth Chart --     Most recent vital signs: Vitals:   07/18/24 1118 07/18/24 1205  BP: 135/78   Pulse: 86   Resp: 20   Temp: 97.7 F (36.5 C)   SpO2: 100% 100%     General: Awake, no distress.  CV:  Good peripheral perfusion.  Resp:  Normal effort.  No thoracic cage tenderness Abd:  No distention.  Soft nontender Other:  No palpable skull deformities or tenderness, no midline spinal tenderness, no tenderness to her back, full range of motion of the bilateral lower  extremities are intact without bony tenderness, DP pulses are intact, no focal weakness or numbness.  No tenderness to her left upper extremity, she has reduced range of motion to her right shoulder due to pain, tenderness to the right shoulder and humerus, does have tenderness to her to palpation of her elbow although I am able to fully range her elbow, able to range her wrist, grip strength intact, radial pulses are intact, no sensory deficits.   ED Results / Procedures / Treatments   Labs (all labs ordered are listed, but only abnormal results are displayed) Labs Reviewed - No data to display   RADIOLOGY On my independent interpretation, x-ray shows a proximal humeral fracture.   PROCEDURES:  Critical Care performed: No  Procedures   MEDICATIONS ORDERED IN ED: Medications  acetaminophen  (TYLENOL ) tablet 1,000 mg (1,000 mg Oral Given 07/18/24 1215)  oxyCODONE  (Oxy IR/ROXICODONE ) immediate release tablet 5 mg (5 mg Oral Given 07/18/24 1215)     IMPRESSION / MDM / ASSESSMENT AND  PLAN / ED COURSE  I reviewed the triage vital signs and the nursing notes.                              Differential diagnosis includes, but is not limited to, fracture, strain, sprain, dislocation.  She denies any head strike, no LOC, not on blood thinners, no indication for CT head imaging at this time.  No midline spinal tenderness or focal deficits to suggest cervical spine injury.  Will hold off CT cervical spine at this time.  Will get x-rays of the shoulder,  Patient's presentation is most consistent with acute presentation with potential threat to life or bodily function.  Independent interpretation of imaging below.  On reassessment patient controlled this time, she is in the sling.  Discussed with her and husband about outpatient follow-up with repeat surgery, will give phone number to call.  Will give her a short course of oxycodone  for severe breakthrough pain.  Otherwise she can take Tylenol   as needed for pain.  Considered but no indication for inpatient admission at this time, she safe for outpatient management.  Will discharge her with strict precautions.  Shared decision making done with patient and husband they are agreeable with this plan.    Clinical Course as of 07/18/24 1322  Thu Jul 18, 2024  1227 DG Shoulder Right IMPRESSION: Mildly displaced, longitudinally oriented fracture of the greater tuberosity of the humerus.   [TT]  1315 DG Elbow Complete Right No acute findings.  [TT]    Clinical Course User Index [TT] Waymond Lorelle Cummins, MD     FINAL CLINICAL IMPRESSION(S) / ED DIAGNOSES   Final diagnoses:  Acute pain of right shoulder  Fall, initial encounter  Right elbow pain  Other closed displaced fracture of proximal end of right humerus, initial encounter     Rx / DC Orders   ED Discharge Orders          Ordered    oxyCODONE  (ROXICODONE ) 5 MG immediate release tablet  Every 8 hours PRN        07/18/24 1322             Note:  This document was prepared using Dragon voice recognition software and may include unintentional dictation errors.    Waymond Lorelle Cummins, MD 07/18/24 [phone]

## 2024-07-18 NOTE — Discharge Instructions (Addendum)
 You can take 650 mg of Tylenol  every 6 hours as needed for pain.  Please reserve the oxycodone  for severe breakthrough pain.  Please do not drive or operate heavy machinery when you are on the oxycodone .  I have left you a number to call for Dr. Tobie to schedule follow-up in a week.

## 2024-07-18 NOTE — ED Triage Notes (Signed)
 Pt sts that she was pulled to the ground by her grand dog and landed on her right shoulder. Pt is crying in triage.

## 2024-07-22 ENCOUNTER — Ambulatory Visit: Payer: Self-pay | Admitting: Anesthesiology

## 2024-07-22 ENCOUNTER — Other Ambulatory Visit: Payer: Self-pay

## 2024-07-22 ENCOUNTER — Ambulatory Visit
Admission: RE | Admit: 2024-07-22 | Discharge: 2024-07-22 | Disposition: A | Discharge status: Home / Self Care | Attending: Ophthalmology | Admitting: Ophthalmology

## 2024-07-22 ENCOUNTER — Encounter
Admission: RE | Disposition: A | Discharge status: Home / Self Care | Payer: Self-pay | Source: Home / Self Care | Attending: Ophthalmology

## 2024-07-22 ENCOUNTER — Encounter: Payer: Self-pay | Admitting: Ophthalmology

## 2024-07-22 DIAGNOSIS — Z961 Presence of intraocular lens: Secondary | ICD-10-CM | POA: Diagnosis not present

## 2024-07-22 DIAGNOSIS — H2512 Age-related nuclear cataract, left eye: Secondary | ICD-10-CM | POA: Diagnosis present

## 2024-07-22 DIAGNOSIS — Z9841 Cataract extraction status, right eye: Secondary | ICD-10-CM | POA: Diagnosis not present

## 2024-07-22 DIAGNOSIS — M810 Age-related osteoporosis without current pathological fracture: Secondary | ICD-10-CM | POA: Diagnosis not present

## 2024-07-22 HISTORY — PX: CATARACT EXTRACTION W/PHACO: SHX586

## 2024-07-22 SURGERY — PHACOEMULSIFICATION, CATARACT, WITH IOL INSERTION
Anesthesia: Monitor Anesthesia Care | Site: Eye | Laterality: Left

## 2024-07-22 MED ORDER — DEXMEDETOMIDINE HCL IN NACL 80 MCG/20ML IV SOLN
INTRAVENOUS | Status: AC
  Filled 2024-07-22: qty 20

## 2024-07-22 MED ORDER — LACTATED RINGERS IV SOLN: INTRAVENOUS | Status: DC

## 2024-07-22 MED ORDER — DEXMEDETOMIDINE HCL IN NACL 400 MCG/100ML IV SOLN
INTRAVENOUS | Status: DC | PRN
  Administered 2024-07-22 (×2): 4 ug via INTRAVENOUS

## 2024-07-22 MED ORDER — SIGHTPATH DOSE#1 NA HYALUR & NA CHOND-NA HYALUR IO KIT
PACK | INTRAOCULAR | Status: DC | PRN
  Administered 2024-07-22: 1 via OPHTHALMIC

## 2024-07-22 MED ORDER — SIGHTPATH DOSE#1 BSS IO SOLN
INTRAOCULAR | Status: DC | PRN
  Administered 2024-07-22: 90 mL via OPHTHALMIC

## 2024-07-22 MED ORDER — MIDAZOLAM HCL 2 MG/2ML IJ SOLN
INTRAMUSCULAR | Status: AC
  Filled 2024-07-22: qty 2

## 2024-07-22 MED ORDER — MIDAZOLAM HCL (PF) 2 MG/2ML IJ SOLN
INTRAMUSCULAR | Status: DC | PRN
  Administered 2024-07-22 (×3): 1 mg via INTRAVENOUS

## 2024-07-22 MED ORDER — LIDOCAINE HCL (PF) 2 % IJ SOLN
INTRAOCULAR | Status: DC | PRN
  Administered 2024-07-22: 4 mL via INTRAOCULAR

## 2024-07-22 MED ORDER — FENTANYL CITRATE (PF) 100 MCG/2ML IJ SOLN
INTRAMUSCULAR | Status: AC
  Filled 2024-07-22: qty 2

## 2024-07-22 MED ORDER — MOXIFLOXACIN HCL 0.5 % OP SOLN
OPHTHALMIC | Status: DC | PRN
  Administered 2024-07-22: .2 mL via OPHTHALMIC

## 2024-07-22 MED ORDER — TETRACAINE HCL 0.5 % OP SOLN
1.0000 [drp] | OPHTHALMIC | Status: DC | PRN
  Administered 2024-07-22 (×3): 1 [drp] via OPHTHALMIC

## 2024-07-22 MED ORDER — BRIMONIDINE TARTRATE-TIMOLOL 0.2-0.5 % OP SOLN
OPHTHALMIC | Status: DC | PRN
  Administered 2024-07-22: 1 [drp] via OPHTHALMIC

## 2024-07-22 MED ORDER — FENTANYL CITRATE (PF) 100 MCG/2ML IJ SOLN
INTRAMUSCULAR | Status: DC | PRN
  Administered 2024-07-22 (×2): 50 ug via INTRAVENOUS

## 2024-07-22 MED ORDER — ARMC OPHTHALMIC DILATING DROPS
1.0000 | OPHTHALMIC | Status: DC | PRN
  Administered 2024-07-22 (×3): 1 via OPHTHALMIC

## 2024-07-22 MED ORDER — SIGHTPATH DOSE#1 BSS IO SOLN
INTRAOCULAR | Status: DC | PRN
Start: 2024-07-22 — End: 2024-07-22
  Administered 2024-07-22: 15 mL via INTRAOCULAR

## 2024-07-22 MED ORDER — ARMC OPHTHALMIC DILATING DROPS
OPHTHALMIC | Status: AC
  Filled 2024-07-22: qty 0.5

## 2024-07-22 MED ORDER — TETRACAINE HCL 0.5 % OP SOLN
OPHTHALMIC | Status: AC
  Filled 2024-07-22: qty 4

## 2024-07-22 SURGICAL SUPPLY — 9 items
DISSECTOR HYDRO NUCLEUS 50X22 (MISCELLANEOUS) ×1 IMPLANT
FEE CATARACT SUITE SIGHTPATH (MISCELLANEOUS) ×1 IMPLANT
GLOVE PI ULTRA LF STRL 7.5 (GLOVE) ×1 IMPLANT
GLOVE SURG SYN 8.5 PF PI BL (GLOVE) ×1 IMPLANT
LENS IOL TECNIS EYHANCE 20.0 (Intraocular Lens) IMPLANT
NDL FILTER BLUNT 18X1 1/2 (NEEDLE) ×1 IMPLANT
NEEDLE FILTER BLUNT 18X1 1/2 (NEEDLE) ×1 IMPLANT
SUT NYLON 10-0 (SUTURE) IMPLANT
SYR 3ML LL SCALE MARK (SYRINGE) ×1 IMPLANT

## 2024-07-22 NOTE — Op Note (Signed)
 OPERATIVE NOTE  Linda Ritter [id number] 07/22/2024   PREOPERATIVE DIAGNOSIS:  Nuclear sclerotic cataract left eye.  H25.12   POSTOPERATIVE DIAGNOSIS:    Nuclear sclerotic cataract left eye.     PROCEDURE:  Phacoemusification with posterior chamber intraocular lens placement of the left eye   LENS:   Implant Name Type Inv. Item Serial No. Manufacturer Lot No. LRB No. Used Action  LENS IOL TECNIS EYHANCE 20.0 - [id number] Intraocular Lens LENS IOL TECNIS EYHANCE 20.0 [id number] [employer]  Left 1 Implanted      Procedure(s): PHACOEMULSIFICATION, CATARACT, WITH IOL INSERTION 3.92 00:27.5 (Left)  SURGEON:  Adine Novak, MD, MPH   ANESTHESIA:  Topical with tetracaine drops augmented with 1% preservative-free intracameral lidocaine .  ESTIMATED BLOOD LOSS: <1 mL   COMPLICATIONS:  None.   DESCRIPTION OF PROCEDURE:  The patient was identified in the holding room and transported to the operating room and placed in the supine position under the operating microscope.  The left eye was identified as the operative eye and it was prepped and draped in the usual sterile ophthalmic fashion.   A 1.0 millimeter clear-corneal paracentesis was made at the 5:00 position. 0.5 ml of preservative-free 1% lidocaine  with epinephrine  was injected into the anterior chamber.  The anterior chamber was filled with viscoelastic.  A 2.4 millimeter keratome was used to make a near-clear corneal incision at the 2:00 position.  A curvilinear capsulorrhexis was made with a cystotome and capsulorrhexis forceps.  Balanced salt solution was used to hydrodissect and hydrodelineate the nucleus.   Phacoemulsification was then used in stop and chop fashion to remove the lens nucleus and epinucleus.  The remaining cortex was then removed using the irrigation and aspiration handpiece. Viscoelastic was then placed into the capsular bag to distend it for lens placement.  A lens was then injected into the capsular bag.  The  remaining viscoelastic was aspirated.   The patient had 12 RK incisions, and the radial partial thickness RK incision at the primary cataract surgery wound temporally was slightly opened and leaking.    A figure of 8 suture was placed through the primary wound, creating a tight seal.  The wound was checked with a fluorescein strip and was seidel negative.   Wounds were hydrated with balanced salt solution.  The anterior chamber was inflated to a physiologic pressure with balanced salt solution.  Intracameral vigamox 0.1 mL undiltued was injected into the eye and a drop placed onto the ocular surface.  No wound leaks were noted.  The patient was taken to the recovery room in stable condition without complications of anesthesia or surgery  Adine Novak 07/22/2024, 9:25 AM

## 2024-07-22 NOTE — Transfer of Care (Signed)
 Immediate Anesthesia Transfer of Care Note  Patient: Linda Ritter  Procedure(s) Performed: PHACOEMULSIFICATION, CATARACT, WITH IOL INSERTION 3.92 00:27.5 (Left: Eye)  Patient Location: PACU  Anesthesia Type: MAC  Level of Consciousness: awake, alert  and patient cooperative  Airway and Oxygen Therapy: Patient Spontanous Breathing and Patient connected to supplemental oxygen  Post-op Assessment: Post-op Vital signs reviewed, Patient's Cardiovascular Status Stable, Respiratory Function Stable, Patent Airway and No signs of Nausea or vomiting  Post-op Vital Signs: Reviewed and stable  Complications: No notable events documented.

## 2024-07-22 NOTE — Anesthesia Postprocedure Evaluation (Signed)
 Anesthesia Post Note  Patient: Hanifa Antonetti  Procedure(s) Performed: PHACOEMULSIFICATION, CATARACT, WITH IOL INSERTION 3.92 00:27.5 (Left: Eye)  Patient location during evaluation: PACU Anesthesia Type: MAC Level of consciousness: awake and alert Pain management: pain level controlled Vital Signs Assessment: post-procedure vital signs reviewed and stable Respiratory status: spontaneous breathing, nonlabored ventilation, respiratory function stable and patient connected to nasal cannula oxygen Cardiovascular status: stable and blood pressure returned to baseline Postop Assessment: no apparent nausea or vomiting Anesthetic complications: no   No notable events documented.   Last Vitals:  Vitals:   07/22/24 0930 07/22/24 0932  BP: 112/64 107/68  Pulse: 71 73  Resp: 11 10  Temp:  (!) 36.2 C  SpO2: 95% 94%    Last Pain:  Vitals:   07/22/24 0932  TempSrc:   PainSc: 0-No pain                 Lasean Rahming C Natalie Mceuen

## 2024-07-22 NOTE — H&P (Signed)
 [facility]   Primary Care Physician:  Keven Crumbly Pap, MD Ophthalmologist: Dr. Adine Novak  Pre-Procedure History & Physical: HPI:  Monteen Toops is a 68 y.o. female here for cataract surgery.   Past Medical History:  Diagnosis Date   Acute biliary pancreatitis without infection or necrosis    Anxiety    Depression    Elevated LFTs    GERD (gastroesophageal reflux disease)    Hepatic steatosis    Osteoporosis     Past Surgical History:  Procedure Laterality Date   CATARACT EXTRACTION W/PHACO Right 07/08/2024   Procedure: PHACOEMULSIFICATION, CATARACT, WITH IOL INSERTION 3.19 00:29.8;  Surgeon: Novak Adine Anes, MD;  Location: [facility];  Service: Ophthalmology;  Laterality: Right;   FRACTURE SURGERY Right    wrist   OOPHORECTOMY Right     Prior to Admission medications   Medication Sig Start Date End Date Taking? Authorizing Provider  calcium-vitamin D (OSCAL WITH D) 500-5 MG-MCG tablet Take 1 tablet by mouth.   Yes [provider]  Multiple Vitamin (MULTIVITAMIN) tablet Take 1 tablet by mouth daily.   Yes [provider]  ondansetron  (ZOFRAN ) 4 MG tablet Take 1 tablet (4 mg total) by mouth every 6 (six) hours as needed for nausea. 03/07/23  Yes Caleen Qualia, MD  oxyCODONE  (ROXICODONE ) 5 MG immediate release tablet Take 1 tablet (5 mg total) by mouth every 8 (eight) hours as needed for up to 12 doses. 07/18/24  Yes Waymond Lorelle Cummins, MD  sertraline (ZOLOFT) 25 MG tablet Take 25 mg by mouth daily. Patient not taking: Reported on 07/02/2024 03/11/21   [provider]    Allergies as of 06/27/2024   (No Known Allergies)    Family History  Problem Relation Age of Onset   Breast cancer Neg Hx     Social History   Socioeconomic History   Marital status: Married    Spouse name: Not on file   Number of children: Not on file   Years of education: Not on file   Highest education level: Not on file  Occupational History    Not on file  Tobacco Use   Smoking status: Never   Smokeless tobacco: Never  Vaping Use   Vaping status: Never Used  Substance and Sexual Activity   Alcohol use: Never   Drug use: Never   Sexual activity: Not on file  Other Topics Concern   Not on file  Social History Narrative   Not on file   Social Drivers of Health   Financial Resource Strain: Low Risk  (05/16/2024)   Received from [facility]   Overall Financial Resource Strain (CARDIA)    How hard is it for you to pay for the very basics like food, housing, medical care, and heating?: Not very hard  Food Insecurity: No Food Insecurity (05/23/2024)   Received [facility]   Hunger Vital Sign    Within the past 12 months, you worried that your food would run out before you got the money to buy more.: Never true    Within the past 12 months, the food you bought just didn't last and you didn't have money to get more.: Never true  Transportation Needs: No Transportation Needs (05/23/2024)   Received from [facility]   PRAPARE - Transportation    Lack of Transportation (Medical): No    Lack of Transportation (Non-Medical): No  Physical Activity: Not on file  Stress: No Stress Concern Present (05/23/2023)  Received from [facility]ational [facility] - Occupational Stress Questionnaire    Feeling of Stress : Not at all  Social Connections: Not on file  Intimate Partner Violence: Not At Risk (03/05/2023)   Humiliation, Afraid, Rape, and Kick questionnaire    Fear of Current or Ex-Partner: No    Emotionally Abused: No    Physically Abused: No    Sexually Abused: No    Review of Systems: See HPI, otherwise negative ROS  Physical Exam: BP 125/73   Pulse 79   Temp 99.9 F (37.7 C) (Temporal)   Resp 12   Ht 5' 1 (1.549 m)   Wt 58.5 kg   SpO2 98%   BMI 24.37 kg/m  General:   Alert, cooperative. Head:  Normocephalic and atraumatic. Respiratory:  Normal work of  breathing. Cardiovascular:  NAD  Impression/Plan: Talyn Eddie is here for cataract surgery.  Risks, benefits, limitations, and alternatives regarding cataract surgery have been reviewed with the patient.  Questions have been answered.  All parties agreeable.   Adine Novak, MD  07/22/2024, 8:45 AM

## 2024-07-25 ENCOUNTER — Other Ambulatory Visit: Payer: Self-pay | Admitting: Orthopedic Surgery

## 2024-07-29 ENCOUNTER — Other Ambulatory Visit: Payer: Self-pay

## 2024-07-29 ENCOUNTER — Encounter
Admission: RE | Admit: 2024-07-29 | Discharge: 2024-07-29 | Disposition: A | Discharge status: Home / Self Care | Source: Ambulatory Visit | Attending: Orthopedic Surgery | Admitting: Orthopedic Surgery

## 2024-07-29 DIAGNOSIS — Z01810 Encounter for preprocedural cardiovascular examination: Secondary | ICD-10-CM

## 2024-07-29 DIAGNOSIS — Z01812 Encounter for preprocedural laboratory examination: Secondary | ICD-10-CM

## 2024-07-29 DIAGNOSIS — S42251A Displaced fracture of greater tuberosity of right humerus, initial encounter for closed fracture: Secondary | ICD-10-CM

## 2024-07-29 HISTORY — DX: Displaced fracture of greater tuberosity of unspecified humerus, initial encounter for closed fracture: S42.253A

## 2024-07-29 MED ORDER — CEFAZOLIN SODIUM-DEXTROSE 2-4 GM/100ML-% IV SOLN
2.0000 g | INTRAVENOUS | Status: AC
  Administered 2024-07-30: 2 g via INTRAVENOUS

## 2024-07-29 MED ORDER — CHLORHEXIDINE GLUCONATE 0.12 % MT SOLN
15.0000 mL | Freq: Once | OROMUCOSAL | Status: AC
  Administered 2024-07-30: 15 mL via OROMUCOSAL

## 2024-07-29 MED ORDER — LACTATED RINGERS IV SOLN: INTRAVENOUS | Status: DC

## 2024-07-29 MED ORDER — ORAL CARE MOUTH RINSE: 15.0000 mL | Freq: Once | OROMUCOSAL | Status: AC

## 2024-07-29 NOTE — Patient Instructions (Signed)
 Your procedure is scheduled on:07-30-24 Tuesday Report to the Registration Desk on the 1st floor of the Medical Mall.Then proceed to the 2nd floor Surgery Desk To find out your arrival time, please call [phone] between 1PM - 3PM on:07-29-24 Monday If your arrival time is 6:00 am, do not arrive before that time as the Medical Mall entrance doors do not open until 6:00 am.  REMEMBER: Instructions that are not followed completely may result in serious medical risk, up to and including death; or upon the discretion of your surgeon and anesthesiologist your surgery may need to be rescheduled.  Do not eat food after midnight the night before surgery.  No gum chewing or hard candies.  You may however, drink CLEAR liquids up to 2 hours before you are scheduled to arrive for your surgery. Do not drink anything within 2 hours of your scheduled arrival time.  Clear liquids include: - water  - apple juice without pulp - gatorade (not RED colors) - black coffee or tea (Do NOT add milk or creamers to the coffee or tea) Do NOT drink anything that is not on this list.  In addition, your doctor has ordered for you to drink the provided:  Ensure Pre-Surgery Clear Carbohydrate Drink  Drinking this carbohydrate drink up to two hours before surgery helps to reduce insulin resistance and improve patient outcomes. Please complete drinking 2 hours before scheduled arrival time.  One week prior to surgery:Stop NOW (07-29-24) Stop Anti-inflammatories (NSAIDS) such as Advil, Aleve, Ibuprofen, Motrin, Naproxen, Naprosyn and Aspirin products (Goody's Powders, Excedrin)  You may however, continue to take Tylenol /Oxycodone  if needed for pain up until the day of surgery.  Continue taking all of your other prescription medications up until the day of surgery.  ON THE DAY OF SURGERY ONLY TAKE THESE MEDICATIONS WITH SIPS OF WATER: -You may take oxyCODONE  (ROXICODONE ) OR Tylenol  if needed for pain  No Alcohol for  24 hours before or after surgery.  No Smoking including e-cigarettes for 24 hours before surgery.  No chewable tobacco products for at least 6 hours before surgery.  No nicotine patches on the day of surgery.  Do not use any recreational drugs for at least a week (preferably 2 weeks) before your surgery.  Please be advised that the combination of cocaine and anesthesia may have negative outcomes, up to and including death. If you test positive for cocaine, your surgery will be cancelled.  On the morning of surgery brush your teeth with toothpaste and water, you may rinse your mouth with mouthwash if you wish. Do not swallow any toothpaste or mouthwash.  Use CHG Soap as directed on instruction sheet.  Do not wear jewelry, make-up, hairpins, clips or nail polish.  For welded (permanent) jewelry: bracelets, anklets, waist bands, etc.  Please have this removed prior to surgery.  If it is not removed, there is a chance that hospital personnel will need to cut it off on the day of surgery.  Do not wear lotions, powders, or perfumes.   Do not shave body hair from the neck down 48 hours before surgery.  Contact lenses, hearing aids and dentures may not be worn into surgery.  Do not bring valuables to the hospital. [facility] is not responsible for any missing/lost belongings or valuables.   Notify your doctor if there is any change in your medical condition (cold, fever, infection).  Wear comfortable clothing (specific to your surgery type) to the hospital.  After surgery, you can help prevent  lung complications by doing breathing exercises.  Take deep breaths and cough every 1-2 hours. Your doctor may order a device called an Incentive Spirometer to help you take deep breaths. When coughing or sneezing, hold a pillow firmly against your incision with both hands. This is called "splinting." Doing this helps protect your incision. It also decreases belly discomfort.  If you are being  admitted to the hospital overnight, leave your suitcase in the car. After surgery it may be brought to your room.  In case of increased patient census, it may be necessary for you, the patient, to continue your postoperative care in the Same Day Surgery department.  If you are being discharged the day of surgery, you will not be allowed to drive home. You will need a responsible individual to drive you home and stay with you for 24 hours after surgery.   If you are taking public transportation, you will need to have a responsible individual with you.  Please call the Pre-admissions Testing Dept. at [phone] if you have any questions about these instructions.  Surgery Visitation Policy:  Patients having surgery or a procedure may have two visitors.  Children under the age of 47 must have an adult with them who is not the patient.                                                                                                             Preparing for Surgery with CHLORHEXIDINE GLUCONATE (CHG) Soap  Chlorhexidine Gluconate (CHG) Soap  o An antiseptic cleaner that kills germs and bonds with the skin to continue killing germs even after washing  o Used for showering the night before surgery and morning of surgery  Before surgery, you can play an important role by reducing the number of germs on your skin.  CHG (Chlorhexidine gluconate) soap is an antiseptic cleanser which kills germs and bonds with the skin to continue killing germs even after washing.  Please do not use if you have an allergy to CHG or antibacterial soaps. If your skin becomes reddened/irritated stop using the CHG.  1. Shower the NIGHT BEFORE SURGERY with CHG soap.  2. If you choose to wash your hair, wash your hair first as usual with your normal shampoo.  3. After shampooing, rinse your hair and body thoroughly to remove the shampoo.  4. Use CHG as you would any other liquid soap. You can apply CHG directly  to the skin and wash gently with a clean washcloth.  5. Apply the CHG soap to your body only from the neck down. Do not use on open wounds or open sores. Avoid contact with your eyes, ears, mouth, and genitals (private parts). Wash face and genitals (private parts) with your normal soap.  6. Wash thoroughly, paying special attention to the area where your surgery will be performed.  7. Thoroughly rinse your body with warm water.  8. Do not shower/wash with your normal soap after using and rinsing off the CHG soap.  9. Do not use lotions,  oils, etc., after showering with CHG.  10. Pat yourself dry with a clean towel.  11. Wear clean pajamas to bed the night before surgery.  12. Place clean sheets on your bed the night of your shower and do not sleep with pets.  13. Do not apply any deodorants/lotions/powders.  14. Please wear clean clothes to the hospital.  15. Remember to brush your teeth with your regular toothpaste.   Merchand[employer], Retail to address health-related social needs:  [email or web address]

## 2024-07-30 ENCOUNTER — Other Ambulatory Visit: Payer: Self-pay

## 2024-07-30 ENCOUNTER — Ambulatory Visit: Payer: Self-pay | Admitting: Urgent Care

## 2024-07-30 ENCOUNTER — Encounter: Payer: Self-pay | Admitting: Orthopedic Surgery

## 2024-07-30 ENCOUNTER — Ambulatory Visit

## 2024-07-30 ENCOUNTER — Encounter
Admission: RE | Disposition: A | Discharge status: Home / Self Care | Payer: Self-pay | Source: Home / Self Care | Attending: Orthopedic Surgery

## 2024-07-30 ENCOUNTER — Ambulatory Visit: Admitting: Anesthesiology

## 2024-07-30 ENCOUNTER — Ambulatory Visit
Admission: RE | Admit: 2024-07-30 | Discharge: 2024-07-30 | Disposition: A | Discharge status: Home / Self Care | Attending: Orthopedic Surgery | Admitting: Orthopedic Surgery

## 2024-07-30 DIAGNOSIS — K219 Gastro-esophageal reflux disease without esophagitis: Secondary | ICD-10-CM | POA: Diagnosis not present

## 2024-07-30 DIAGNOSIS — S42251A Displaced fracture of greater tuberosity of right humerus, initial encounter for closed fracture: Secondary | ICD-10-CM | POA: Insufficient documentation

## 2024-07-30 DIAGNOSIS — F32A Depression, unspecified: Secondary | ICD-10-CM | POA: Diagnosis not present

## 2024-07-30 DIAGNOSIS — W19XXXA Unspecified fall, initial encounter: Secondary | ICD-10-CM | POA: Insufficient documentation

## 2024-07-30 DIAGNOSIS — F419 Anxiety disorder, unspecified: Secondary | ICD-10-CM | POA: Diagnosis not present

## 2024-07-30 DIAGNOSIS — Z01810 Encounter for preprocedural cardiovascular examination: Secondary | ICD-10-CM

## 2024-07-30 DIAGNOSIS — Z01812 Encounter for preprocedural laboratory examination: Secondary | ICD-10-CM

## 2024-07-30 HISTORY — PX: ORIF HUMERUS FRACTURE: SHX2126

## 2024-07-30 LAB — TYPE AND SCREEN
ABO/RH(D): A NEG
Antibody Screen: NEGATIVE

## 2024-07-30 SURGERY — OPEN REDUCTION INTERNAL FIXATION (ORIF) PROXIMAL HUMERUS FRACTURE
Anesthesia: General | Site: Shoulder | Laterality: Right

## 2024-07-30 MED ORDER — BUPIVACAINE HCL (PF) 0.5 % IJ SOLN
INTRAMUSCULAR | Status: AC
  Filled 2024-07-30: qty 10

## 2024-07-30 MED ORDER — DEXAMETHASONE SOD PHOSPHATE PF 10 MG/ML IJ SOLN
INTRAMUSCULAR | Status: DC | PRN
  Administered 2024-07-30: 5 mg via INTRAVENOUS

## 2024-07-30 MED ORDER — DROPERIDOL 2.5 MG/ML IJ SOLN
0.6250 mg | Freq: Once | INTRAMUSCULAR | Status: AC
  Administered 2024-07-30: 0.625 mg via INTRAVENOUS

## 2024-07-30 MED ORDER — BUPIVACAINE LIPOSOME 1.3 % IJ SUSP
INTRAMUSCULAR | Status: AC
  Filled 2024-07-30: qty 10

## 2024-07-30 MED ORDER — BUPIVACAINE HCL (PF) 0.5 % IJ SOLN
INTRAMUSCULAR | Status: DC | PRN
  Administered 2024-07-30: 10 mL

## 2024-07-30 MED ORDER — CHLORHEXIDINE GLUCONATE 0.12 % MT SOLN
OROMUCOSAL | Status: AC
  Filled 2024-07-30: qty 15

## 2024-07-30 MED ORDER — FENTANYL CITRATE (PF) 100 MCG/2ML IJ SOLN
INTRAMUSCULAR | Status: AC
  Filled 2024-07-30: qty 2

## 2024-07-30 MED ORDER — LIDOCAINE HCL (PF) 1 % IJ SOLN
INTRAMUSCULAR | Status: DC | PRN
  Administered 2024-07-30: 3 mL

## 2024-07-30 MED ORDER — SUGAMMADEX SODIUM 200 MG/2ML IV SOLN
INTRAVENOUS | Status: DC | PRN
  Administered 2024-07-30: 200 mg via INTRAVENOUS

## 2024-07-30 MED ORDER — DROPERIDOL 2.5 MG/ML IJ SOLN
INTRAMUSCULAR | Status: AC
  Filled 2024-07-30: qty 2

## 2024-07-30 MED ORDER — ROCURONIUM BROMIDE 100 MG/10ML IV SOLN
INTRAVENOUS | Status: DC | PRN
  Administered 2024-07-30: 50 mg via INTRAVENOUS

## 2024-07-30 MED ORDER — PENTAFLUOROPROP-TETRAFLUOROETH EX AERO
INHALATION_SPRAY | CUTANEOUS | Status: AC
  Filled 2024-07-30: qty 30

## 2024-07-30 MED ORDER — ONDANSETRON HCL 4 MG/2ML IJ SOLN
INTRAMUSCULAR | Status: AC
  Filled 2024-07-30: qty 2

## 2024-07-30 MED ORDER — ONDANSETRON 4 MG PO TBDP: 4.0000 mg | ORAL_TABLET | Freq: Three times a day (TID) | ORAL | 0 refills | Status: AC | PRN

## 2024-07-30 MED ORDER — ASPIRIN 325 MG PO TBEC: 325.0000 mg | DELAYED_RELEASE_TABLET | Freq: Every day | ORAL | 0 refills | Status: AC

## 2024-07-30 MED ORDER — CEFAZOLIN SODIUM-DEXTROSE 2-4 GM/100ML-% IV SOLN
INTRAVENOUS | Status: AC
  Filled 2024-07-30: qty 100

## 2024-07-30 MED ORDER — ARTIFICIAL TEARS OPHTHALMIC OINT
TOPICAL_OINTMENT | OPHTHALMIC | Status: AC
  Filled 2024-07-30: qty 3.5

## 2024-07-30 MED ORDER — PROPOFOL 10 MG/ML IV BOLUS
INTRAVENOUS | Status: AC
  Filled 2024-07-30: qty 20

## 2024-07-30 MED ORDER — OXYCODONE HCL 5 MG/5ML PO SOLN: 5.0000 mg | Freq: Once | ORAL | Status: DC | PRN

## 2024-07-30 MED ORDER — ACETAMINOPHEN 500 MG PO TABS: 1000.0000 mg | ORAL_TABLET | Freq: Three times a day (TID) | ORAL | 2 refills | Status: AC

## 2024-07-30 MED ORDER — ONDANSETRON HCL 4 MG/2ML IJ SOLN
INTRAMUSCULAR | Status: DC | PRN
  Administered 2024-07-30: 4 mg via INTRAVENOUS

## 2024-07-30 MED ORDER — MIDAZOLAM HCL 2 MG/2ML IJ SOLN
INTRAMUSCULAR | Status: AC
  Filled 2024-07-30: qty 2

## 2024-07-30 MED ORDER — PROPOFOL 10 MG/ML IV BOLUS
INTRAVENOUS | Status: DC | PRN
  Administered 2024-07-30: 150 mg via INTRAVENOUS

## 2024-07-30 MED ORDER — 0.9 % SODIUM CHLORIDE (POUR BTL) OPTIME
TOPICAL | Status: DC | PRN
  Administered 2024-07-30: 500 mL

## 2024-07-30 MED ORDER — LIDOCAINE HCL (PF) 1 % IJ SOLN
INTRAMUSCULAR | Status: AC
  Filled 2024-07-30: qty 5

## 2024-07-30 MED ORDER — FENTANYL CITRATE (PF) 100 MCG/2ML IJ SOLN
INTRAMUSCULAR | Status: DC | PRN
  Administered 2024-07-30: 25 ug via INTRAVENOUS
  Administered 2024-07-30: 50 ug via INTRAVENOUS
  Administered 2024-07-30: 25 ug via INTRAVENOUS

## 2024-07-30 MED ORDER — LIDOCAINE HCL (CARDIAC) PF 100 MG/5ML IV SOSY
PREFILLED_SYRINGE | INTRAVENOUS | Status: DC | PRN
  Administered 2024-07-30: 20 mg via INTRAVENOUS

## 2024-07-30 MED ORDER — OXYCODONE HCL 5 MG PO TABS: 5.0000 mg | ORAL_TABLET | Freq: Once | ORAL | Status: DC | PRN

## 2024-07-30 MED ORDER — MIDAZOLAM HCL (PF) 2 MG/2ML IJ SOLN
1.0000 mg | INTRAMUSCULAR | Status: DC | PRN
Start: 2024-07-30 — End: 2024-07-30
  Administered 2024-07-30: 1 mg via INTRAVENOUS

## 2024-07-30 MED ORDER — OXYCODONE HCL 5 MG PO TABS: 5.0000 mg | ORAL_TABLET | ORAL | 0 refills | Status: AC | PRN

## 2024-07-30 MED ORDER — FENTANYL CITRATE (PF) 100 MCG/2ML IJ SOLN: 25.0000 ug | INTRAMUSCULAR | Status: DC | PRN

## 2024-07-30 MED ORDER — BUPIVACAINE LIPOSOME 1.3 % IJ SUSP
INTRAMUSCULAR | Status: DC | PRN
  Administered 2024-07-30: 10 mL

## 2024-07-30 SURGICAL SUPPLY — 53 items
BIT DRILL 2.6XCANN 4XSCR (BIT) IMPLANT
CHLORAPREP W/TINT 26 (MISCELLANEOUS) ×1 IMPLANT
CNTNR URN SCR LID CUP LEK RST (MISCELLANEOUS) ×1 IMPLANT
COOLER ICEMAN CLASSIC (MISCELLANEOUS) IMPLANT
DERMABOND ADVANCED .7 DNX12 (GAUZE/BANDAGES/DRESSINGS) IMPLANT
DRAPE C-ARM 42X72 X-RAY (DRAPES) ×2 IMPLANT
DRAPE C-ARM XRAY 36X54 (DRAPES) ×1 IMPLANT
DRAPE INCISE IOBAN 66X45 STRL (DRAPES) ×2 IMPLANT
DRAPE SHEET LG 3/4 BI-LAMINATE (DRAPES) ×2 IMPLANT
DRAPE SURG ORHT 6 SPLT 77X108 (DRAPES) ×2 IMPLANT
DRAPE U-SHAPE 47X51 STRL (DRAPES) ×1 IMPLANT
DRSG OPSITE POSTOP 4X8 (GAUZE/BANDAGES/DRESSINGS) IMPLANT
ELECT CAUTERY BLADE 6.4 (BLADE) ×1 IMPLANT
ELECTRODE REM PT RTRN 9FT ADLT (ELECTROSURGICAL) ×1 IMPLANT
GLOVE BIOGEL PI IND STRL 8 (GLOVE) IMPLANT
GLOVE ORTHO TXT STRL SZ7.5 (GLOVE) ×2 IMPLANT
GOWN SRG LRG LVL 4 IMPRV REINF (GOWNS) ×1 IMPLANT
GOWN SRG XL LONG LVL 3 NONREIN (GOWNS) ×1 IMPLANT
HOLDER FOLEY CATH W/STRAP (MISCELLANEOUS) ×1 IMPLANT
IV CATH ANGIO 12GX3 LT BLUE (NEEDLE) IMPLANT
KIT STABILIZATION SHOULDER (MISCELLANEOUS) ×1 IMPLANT
KIT TURNOVER KIT A (KITS) ×1 IMPLANT
KWIRE 3.0 NON-THREADED (WIRE) IMPLANT
KWIRE NONTHRD 1.2 (WIRE) IMPLANT
MANIFOLD NEPTUNE II (INSTRUMENTS) ×1 IMPLANT
MARKER SKIN DUAL TIP RULER LAB (MISCELLANEOUS) ×1 IMPLANT
MASK FACE SPIDER DISP (MASK) ×1 IMPLANT
MAT ABSORB FLUID 56X50 GRAY (MISCELLANEOUS) ×2 IMPLANT
NDL HYPO 25X1 1.5 SAFETY (NEEDLE) ×1 IMPLANT
NEEDLE HYPO 25X1 1.5 SAFETY (NEEDLE) IMPLANT
NS IRRIG 500ML POUR BTL (IV SOLUTION) IMPLANT
PACK ARTHROSCOPY SHOULDER (MISCELLANEOUS) ×1 IMPLANT
PAD COLD SHLDR SM WRAP-ON (PAD) IMPLANT
PENCIL SMOKE EVACUATOR (MISCELLANEOUS) IMPLANT
RETRIEVER SUT HEWSON (MISCELLANEOUS) IMPLANT
SCREW CANN HDLS 4X34 (Screw) IMPLANT
SCREW CANN HDLS 4X50 (Screw) IMPLANT
SCREW CANN MINI 4.0X36 (Screw) IMPLANT
SLING ULTRA II M (MISCELLANEOUS) IMPLANT
SOL .9 NS 3000ML IRR UROMATIC (IV SOLUTION) IMPLANT
SOLN 0.9% NACL POUR BTL 1000ML (IV SOLUTION) ×1 IMPLANT
SPONGE T-LAP 18X18 ~~LOC~~+RFID (SPONGE) ×2 IMPLANT
STAPLER SKIN PROX 35W (STAPLE) IMPLANT
STRAP SAFETY 5IN WIDE (MISCELLANEOUS) ×1 IMPLANT
SUT ETHIBOND #5 BRAIDED 30INL (SUTURE) ×1 IMPLANT
SUT MNCRL AB 4-0 PS2 18 (SUTURE) IMPLANT
SUT TICRON 2-0 30IN 311381 (SUTURE) ×1 IMPLANT
SUT VIC AB 0 CT1 36 (SUTURE) ×1 IMPLANT
SUT VIC AB 2-0 CT2 27 (SUTURE) IMPLANT
SUTURE FIBERWR #2 38 BLUE 1/2 (SUTURE) ×5 IMPLANT
TRAP FLUID SMOKE EVACUATOR (MISCELLANEOUS) ×1 IMPLANT
TRAY FOLEY SLVR 16FR LF STAT (SET/KITS/TRAYS/PACK) ×1 IMPLANT
WATER STERILE IRR 500ML POUR (IV SOLUTION) ×1 IMPLANT

## 2024-07-30 NOTE — Discharge Instructions (Addendum)
 Post-Op Instructions - Rotator Cuff Repair  1. Bracing: You will wear a shoulder immobilizer or sling for 4 weeks.   2. Driving: No driving for 4 weeks post-op.  3. Activity: No active lifting for 2 months. Wrist, hand, and elbow motion only. Avoid lifting the upper arm away from the body except for hygiene. You are permitted to bend and straighten the elbow passively only (no active elbow motion). You may use your hand and wrist for typing, writing, and managing utensils (cutting food). Do not lift more than a coffee cup for 8 weeks.  When sleeping or resting, inclined positions (recliner chair or wedge pillow) and a pillow under the forearm for support may provide better comfort for up to 4 weeks.  Avoid long distance travel for 4 weeks.  Return to normal activities after can take up to 6 months on average. If rehab goes very well, may be able to do most activities at 4 months, except overhead or contact sports.  4. Physical Therapy: Begins 3-4 days after surgery, and proceed 1 time per week for the first 6 weeks, then 1-2 times per week from weeks 6-20 post-op.  5. Medications:  - You will be provided a prescription for narcotic pain medicine. After surgery, take 1-2 narcotic tablets every 4 hours if needed for severe pain.  - A prescription for anti-nausea medication will be provided in case the narcotic medicine causes nausea - take 1 tablet every 6 hours only if nauseated.   - Take tylenol  1000 mg (2 Extra Strength tablets or 3 regular strength) every 8 hours for pain.  May decrease or stop tylenol  5 days after surgery if you are having minimal pain. - Take ASA 325mg /day x 2 weeks to help prevent DVTs/PEs (blood clots).  - DO NOT take ANY nonsteroidal anti-inflammatory pain medications (Advil, Motrin, Ibuprofen, Aleve, Naproxen, or Naprosyn). These medicines can inhibit healing of your shoulder repair.    If you are taking prescription medication for anxiety, depression, insomnia, muscle  spasm, chronic pain, or for attention deficit disorder, you are advised that you are at a higher risk of adverse effects with use of narcotics post-op, including narcotic addiction/dependence, depressed breathing, death. If you use non-prescribed substances: alcohol, marijuana, cocaine, heroin, methamphetamines, etc., you are at a higher risk of adverse effects with use of narcotics post-op, including narcotic addiction/dependence, depressed breathing, death. You are advised that taking > 50 morphine  milligram equivalents (MME) of narcotic pain medication per day results in twice the risk of overdose or death. For your prescription provided: oxycodone  5 mg - taking more than 6 tablets per day would result in > 50 morphine  milligram equivalents (MME) of narcotic pain medication. Be advised that we will prescribe narcotics short-term, for acute post-operative pain only - 3 weeks for major operations such as shoulder repair/reconstruction surgeries.     6. Post-Op Appointment:  Your first post-op appointment will be 10-14 days post-op.  7. Work or School: For most, but not all procedures, we advise staying out of work or school for at least 1 to 2 weeks in order to recover from the stress of surgery and to allow time for healing.   If you need a work or school note this can be provided.   8. Smoking: If you are a smoker, you need to refrain from smoking in the postoperative period. The nicotine in cigarettes will inhibit healing of your shoulder repair and decrease the chance of successful repair. Similarly, nicotine containing products (gum, patches)  should be avoided.   Post-operative Brace: Apply and remove the brace you received as you were instructed to at the time of fitting and as described in detail as the brace's instructions for use indicate.  Wear the brace for the period of time prescribed by your physician.  The brace can be cleaned with soap and water and allowed to air dry only.  Should  the brace result in increased pain, decreased feeling (numbness/tingling), increased swelling or an overall worsening of your medical condition, please contact your doctor immediately.  If an emergency situation occurs as a result of wearing the brace after normal business hours, please dial 911 and seek immediate medical attention.  Let your doctor know if you have any further questions about the brace issued to you. Refer to the shoulder sling instructions for use if you have any questions regarding the correct fit of your shoulder sling.  [employer] Customer Care for Troubleshooting: [phone]  Video that illustrates how to properly use a shoulder sling: Instructions for Proper Use of an Orthopaedic Sling Http://bass.com/       Interscalene Nerve Block with Exparel    For your surgery you have received an Interscalene Nerve Block with Exparel . Nerve Blocks affect many types of nerves, including nerves that control movement, pain and normal sensation.  You may experience feelings such as numbness, tingling, heaviness, weakness or the inability to move your arm or the feeling or sensation that your arm has fallen asleep. A nerve block with Exparel  can last up to 5 days.  Usually the weakness wears off first.  The tingling and heaviness usually wear off next.  Finally you may start to notice pain.  Keep in mind that this may occur in any order.  Once a nerve block starts to wear off it is usually completely gone within 60 minutes. ISNB may cause mild shortness of breath, a hoarse voice, blurry vision, unequal pupils, or drooping of the face on the same side as the nerve block.  These symptoms will usually resolve with the numbness.  Very rarely the procedure itself can cause mild seizures. If needed, your surgeon will give you a prescription for pain medication.  It will take about 60 minutes for the oral pain medication to become fully effective.  So, it is recommended  that you start taking this medication before the nerve block first begins to wear off, or when you first begin to feel discomfort. Take your pain medication only as prescribed.  Pain medication can cause sedation and decrease your breathing if you take more than you need for the level of pain that you have. Nausea is a common side effect of many pain medications.  You may want to eat something before taking your pain medicine to prevent nausea. After an Interscalene nerve block, you cannot feel pain, pressure or extremes in temperature in the effected arm.  Because your arm is numb it is at an increased risk for injury.  To decrease the possibility of injury, please practice the following:  While you are awake change the position of your arm frequently to prevent too much pressure on any one area for prolonged periods of time.  If you have a cast or tight dressing, check the color or your fingers every couple of hours.  Call your surgeon with the appearance of any discoloration (white or blue). If you are given a sling to wear before you go home, please wear it  at all times until the block has completely  worn off.  Do not get up at night without your sling. Please contact [facility] or your surgeon if you do not begin to regain sensation after 7 days from the surgery.  Anesthesia may be contacted by calling the Same Day Surgery Department, Mon. through Fri., 6 am to 4 pm at [phone].   If you experience any other problems or concerns, please contact your surgeon's office. If you experience severe or prolonged shortness of breath go to the nearest emergency department.SHOULDER SLING IMMOBILIZER   VIDEO Slingshot 2 Shoulder Brace Application - YouTube ---[email or web address].info/  INSTRUCTIONS While supporting the injured arm, slide the forearm into the sling. Wrap the adjustable shoulder strap around the neck and shoulders and attach the strap end to the sling using  the  "alligator strap tab."  Adjust the shoulder strap to the required length. Position the shoulder pad behind the neck. To secure the shoulder pad location (optional), pull the shoulder strap away from the shoulder pad, unfold the hook material on the top of the pad, then press the shoulder strap back onto the hook material to secure the pad in place. Attach the closure strap across the open top of the sling. Position the strap so that it holds the arm securely in the sling. Next, attach the thumb strap to the open end of the sling between the thumb and fingers. After sling has been fit, it may be easily removed and reapplied using the quick release buckle on shoulder strap. If a neutral pillow or 15 abduction pillow is included, place the pillow at the waistline. Attach the sling to the pillow, lining up hook material on the pillow with the loop on sling. Adjust the waist strap to fit.  If waist strap is too long, cut it to fit. Use the small piece of double sided hook material (located on top of the pillow) to secure the strap end. Place the double sided hook material on the inside of the cut strap end and secure it to the waist strap.     If no pillow is included, attach the waist strap to the sling and adjust to fit.    Washing Instructions: Straps and sling must be removed and cleaned regularly depending on your activity level and perspiration. Hand wash straps and sling in cold water with mild detergent, rinse, air dry

## 2024-07-30 NOTE — Anesthesia Procedure Notes (Signed)
 Anesthesia Regional Block: Interscalene brachial plexus block   Pre-Anesthetic Checklist: , timeout performed,  Correct Patient, Correct Site, Correct Laterality,  Correct Procedure, Correct Position, site marked,  Risks and benefits discussed,  Surgical consent,  Pre-op evaluation,  At surgeon's request and post-op pain management  Laterality: Left  Prep: alcohol swabs       Needles:  Injection technique: Single-shot  Needle Type: Echogenic Needle     Needle Length: 4cm  Needle Gauge: 25     Additional Needles:   Procedures:,,,, ultrasound used (permanent image in chart),,    Narrative:  Start time: 07/30/2024 11:09 AM End time: 07/30/2024 11:14 AM Injection made incrementally with aspirations every 5 mL.  Performed by: Personally  Anesthesiologist: Chesley Lendia CROME, MD  Additional Notes: Patient's chart reviewed and they were deemed appropriate candidate for procedure, at surgeon's request. Patient educated about risks, benefits, and alternatives of the block including but not limited to: temporary or permanent nerve damage, bleeding, infection, damage to surround tissues, pneumothorax, hemidiaphragmatic paralysis, unilateral Horner's syndrome, block failure, local anesthetic toxicity. Patient expressed understanding. A formal time-out was conducted consistent with institution rules.  Monitors were applied, and minimal sedation used (see nursing record). The site was prepped with skin prep and allowed to dry, and sterile gloves were used. A high frequency linear ultrasound probe with probe cover was utilized throughout. C5-7 nerve roots located and appeared anatomically normal, local anesthetic injected around them, and echogenic block needle trajectory was monitored throughout. Aspiration performed every 5ml. Lung and blood vessels were avoided. All injections were performed without resistance and free of blood and paresthesias. The patient tolerated the procedure  well.  Injectate: 10ml exparel  + 10 0.5% Bupivacaine 

## 2024-07-30 NOTE — Anesthesia Procedure Notes (Signed)
 Procedure Name: Intubation Date/Time: 07/30/2024 11:38 AM  Performed by: Niki Manus SAUNDERS, CRNAPre-anesthesia Checklist: Patient identified, Patient being monitored, Timeout performed, Emergency Drugs available and Suction available Patient Re-evaluated:Patient Re-evaluated prior to induction Oxygen Delivery Method: Circle system utilized Preoxygenation: Pre-oxygenation with 100% oxygen Induction Type: IV induction Ventilation: Mask ventilation without difficulty Laryngoscope Size: McGrath and 4 Grade View: Grade I Tube type: Oral Tube size: 7.0 mm Number of attempts: 1 Airway Equipment and Method: Stylet Placement Confirmation: ETT inserted through vocal cords under direct vision, positive ETCO2 and breath sounds checked- equal and bilateral Secured at: 21 cm Tube secured with: Tape Dental Injury: Teeth and Oropharynx as per pre-operative assessment

## 2024-07-30 NOTE — Op Note (Signed)
 SURGERY DATE: 07/30/2024  PRE-OP DIAGNOSIS:  Right greater tuberosity fracture  POST-OP DIAGNOSIS: Right greater tuberosity fracture  PROCEDURES:  1. ORIF Right greater tuberosity  SURGEON: Earnestine HILARIO Blanch, MD  ASSISTANT: DOROTHA Krystal Doyne, PA   ANESTHESIA: Gen with Exparel  interscalene block  ESTIMATED BLOOD LOSS: 50cc  DRAINS:  none  TOTAL IV FLUIDS: per anesthesia   SPECIMENS: none  IMPLANTS:  - Stryker 4.48mm Headless cannulated screws x 2 (36mm and 50mm)  OPERATIVE REPORT:   Indications for procedure: Linda Ritter is a 68 y.o. female who suffered a displaced greater tuberosity fracture on the right side after a fall. After discussion of risks, benefits, and alternatives to surgery, the patient elected to proceed.   Procedure in detail:  I identified Linda Ritter in the pre-operative holding area.  I marked the operative shoulder with my initials. I reviewed the risks and benefits of the proposed surgical intervention, and the patient (and/or patient's guardian) wished to proceed.  Anesthesia was then performed with an Exparel  interscalene block.  The patient was transferred to the operative suite and placed in the beach chair position.    SCDs were placed on the lower extremities. Appropriate IV antibiotics were administered prior to incision. The operative upper extremity was then prepped and draped in standard fashion. A time out was performed confirming the correct extremity, correct patient, and correct procedure.   A longitudinal incision from the anterolateral acromion ~7cm in length was made overlying the raphe between the anterior and middle heads of the deltoid. The raphe was identified and it was incised. The subacromial space was identified. Any remaining bursa was excised.  The greater tuberosity fracture was identified.  Fracture edges were freshened with a 15 blade.  Fracture bed on the proximal humerus was prepared using a curette and irrigation.  The  fracture was placed into a reduced position.  Fluoroscopy was used to confirm appropriate positioning.  2 K wires were placed across the fracture.  Wires were overdrilled and headless compression screws were placed over the wires.  Fluoroscopy was used to confirm appropriate reduction and hardware position.  The wound was thoroughly irrigated. The deltoid split was closed with 0 Vicryl.  The subdermal layer was closed with 2-0 Vicryl.  The skin was closed with 4-0 Monocryl and Dermabond. Xeroform was applied to the incisions. A sterile dressing was applied, followed by a Polar Care sleeve and a SlingShot shoulder immobilizer/sling. The patient was awakened from anesthesia without difficulty and was transferred to the PACU in stable condition.   Of note, assistance from a PA was essential to performing the surgery.  PA was present for the entire surgery.  PA assisted with patient positioning, retraction, instrumentation, and wound closure. The surgery would have been more difficult and had longer operative time without PA assistance.    COMPLICATIONS: none  DISPOSITION: plan for discharge home after recovery in PACU   POSTOPERATIVE PLAN: Remain in sling (except hygiene and elbow/wrist/hand RoM exercises as instructed by PT) x 4 weeks and NWB for this time. PT to begin 3-4 days after surgery.  Can use small rotator cuff repair rehab protocol.  ASA 325mg  daily x 2 weeks for DVT ppx.

## 2024-07-30 NOTE — Anesthesia Postprocedure Evaluation (Signed)
 Anesthesia Post Note  Patient: Merryn Thaker  Procedure(s) Performed: OPEN REDUCTION INTERNAL FIXATION (ORIF) PROXIMAL HUMERUS FRACTURE (Right: Shoulder)  Patient location during evaluation: PACU Anesthesia Type: General Level of consciousness: awake and alert Pain management: pain level controlled Vital Signs Assessment: post-procedure vital signs reviewed and stable Respiratory status: spontaneous breathing, nonlabored ventilation, respiratory function stable and patient connected to nasal cannula oxygen Cardiovascular status: blood pressure returned to baseline and stable Postop Assessment: no apparent nausea or vomiting Anesthetic complications: no   No notable events documented.   Last Vitals:  Vitals:   07/30/24 1345 07/30/24 1400  BP: 123/65 120/61  Pulse: 81 82  Resp: 14 15  Temp:    SpO2: 100% 99%    Last Pain:  Vitals:   07/30/24 1400  TempSrc:   PainSc: Asleep                 Lendia LITTIE Mae

## 2024-07-30 NOTE — H&P (Signed)
 DELAYED ENTRY, PATIENT SEEN AND EVALUATED TODAY ~11am.  Paper H&P to be scanned into permanent record. H&P reviewed. No significant changes noted.

## 2024-07-30 NOTE — Anesthesia Preprocedure Evaluation (Addendum)
 Anesthesia Evaluation  Patient identified by MRN, date of birth, ID band Patient awake    Reviewed: Allergy & Precautions, NPO status , Patient's Chart, lab work & pertinent test results  History of Anesthesia Complications Negative for: history of anesthetic complications  Airway Mallampati: III  TM Distance: >3 FB Neck ROM: full    Dental no notable dental hx.    Pulmonary neg pulmonary ROS   Pulmonary exam normal        Cardiovascular negative cardio ROS Normal cardiovascular exam     Neuro/Psych  PSYCHIATRIC DISORDERS Anxiety Depression    negative neurological ROS     GI/Hepatic Neg liver ROS,GERD  Medicated,,  Endo/Other  negative endocrine ROS    Renal/GU negative Renal ROS  negative genitourinary   Musculoskeletal   Abdominal   Peds  Hematology negative hematology ROS (+)   Anesthesia Other Findings Past Medical History: No date: Acute biliary pancreatitis without infection or necrosis No date: Anxiety No date: Depression No date: Elevated LFTs No date: Fracture of humerus greater tuberosity No date: GERD (gastroesophageal reflux disease) No date: Hepatic steatosis No date: Osteoporosis  Past Surgical History: 07/08/2024: CATARACT EXTRACTION W/PHACO; Right     Comment:  Procedure: PHACOEMULSIFICATION, CATARACT, WITH IOL               INSERTION 3.19 00:29.8;  Surgeon: Myrna Adine Anes, MD;              Location: [facility];  Service: Ophthalmology;                Laterality: Right; 07/22/2024: CATARACT EXTRACTION W/PHACO; Left     Comment:  Procedure: PHACOEMULSIFICATION, CATARACT, WITH IOL               INSERTION 3.92 00:27.5;  Surgeon: Myrna Adine Anes, MD;              Location: [facility];  Service: Ophthalmology;                Laterality: Left; No date: CERVICAL BIOPSY  W/ LOOP ELECTRODE EXCISION No date: CHOLECYSTECTOMY No date: COLONOSCOPY No date: EYE SURGERY No  date: FRACTURE SURGERY; Right     Comment:  wrist No date: KNEE SURGERY; Right No date: OOPHORECTOMY; Right No date: REFRACTIVE SURGERY     Reproductive/Obstetrics negative OB ROS                              Anesthesia Physical Anesthesia Plan  ASA: 2  Anesthesia Plan: General   Post-op Pain Management: Ofirmev  IV (intra-op)*, Toradol  IV (intra-op)* and Regional block*   Induction: Intravenous  PONV Risk Score and Plan: 3 and Midazolam , Ondansetron , Dexamethasone  and Treatment may vary due to age or medical condition  Airway Management Planned: Oral ETT  Additional Equipment:   Intra-op Plan:   Post-operative Plan: Extubation in OR  Informed Consent: I have reviewed the patients History and Physical, chart, labs and discussed the procedure including the risks, benefits and alternatives for the proposed anesthesia with the patient or authorized representative who has indicated his/her understanding and acceptance.     Dental Advisory Given  Plan Discussed with: Anesthesiologist, CRNA and Surgeon  Anesthesia Plan Comments: (Patient consented for risks of anesthesia including but not limited to:  - adverse reactions to medications - damage to eyes, teeth, lips or other oral mucosa - nerve damage due to positioning  - sore throat or hoarseness -  Damage to heart, brain, nerves, lungs, other parts of body or loss of life  Patient voiced understanding and assent.)        Anesthesia Quick Evaluation

## 2024-07-30 NOTE — Transfer of Care (Signed)
 Immediate Anesthesia Transfer of Care Note  Patient: Linda Ritter  Procedure(s) Performed: OPEN REDUCTION INTERNAL FIXATION (ORIF) PROXIMAL HUMERUS FRACTURE (Right: Shoulder)  Patient Location: PACU  Anesthesia Type:General  Level of Consciousness: awake and alert   Airway & Oxygen Therapy: Patient Spontanous Breathing and Patient connected to nasal cannula oxygen  Post-op Assessment: Report given to RN and Post -op Vital signs reviewed and stable  Post vital signs: Reviewed and stable  Last Vitals:  Vitals Value Taken Time  BP 123/65 07/30/24 13:45  Temp 36.2 C 07/30/24 13:41  Pulse 77 07/30/24 13:47  Resp 15 07/30/24 13:47  SpO2 100 % 07/30/24 13:47  Vitals shown include unfiled device data.  Last Pain:  Vitals:   07/30/24 0948  TempSrc: Temporal  PainSc: 0-No pain         Complications: No notable events documented.

## 2024-07-31 ENCOUNTER — Encounter: Payer: Self-pay | Admitting: Orthopedic Surgery

## 2024-08-14 ENCOUNTER — Ambulatory Visit
Admission: RE | Admit: 2024-08-14 | Discharge: 2024-08-14 | Disposition: A | Discharge status: Home / Self Care | Source: Ambulatory Visit | Attending: Orthopedic Surgery | Admitting: Orthopedic Surgery

## 2024-08-14 ENCOUNTER — Other Ambulatory Visit: Payer: Self-pay | Admitting: Orthopedic Surgery

## 2024-08-14 DIAGNOSIS — S42251D Displaced fracture of greater tuberosity of right humerus, subsequent encounter for fracture with routine healing: Secondary | ICD-10-CM
